# Patient Record
Sex: Male | Born: 1990
Health system: Southern US, Community
[De-identification: ages and names within clinical notes are randomized; demographics above are authoritative.]

---

## 2020-01-07 ENCOUNTER — Other Ambulatory Visit: Payer: Self-pay

## 2020-01-07 DIAGNOSIS — Z20822 Contact with and (suspected) exposure to covid-19: Secondary | ICD-10-CM

## 2020-01-09 LAB — NOVEL CORONAVIRUS, NAA: SARS-CoV-2, NAA: NOT DETECTED

## 2020-01-09 LAB — SARS-COV-2, NAA 2 DAY TAT

## 2020-05-26 ENCOUNTER — Ambulatory Visit: Payer: Self-pay

## 2020-11-07 DIAGNOSIS — Z20822 Contact with and (suspected) exposure to covid-19: Secondary | ICD-10-CM | POA: Diagnosis not present

## 2020-11-17 DIAGNOSIS — Z130 Encounter for screening for diseases of the blood and blood-forming organs and certain disorders involving the immune mechanism: Secondary | ICD-10-CM | POA: Diagnosis not present

## 2020-11-17 DIAGNOSIS — Z13228 Encounter for screening for other metabolic disorders: Secondary | ICD-10-CM | POA: Diagnosis not present

## 2020-11-17 DIAGNOSIS — B009 Herpesviral infection, unspecified: Secondary | ICD-10-CM | POA: Diagnosis not present

## 2020-11-17 DIAGNOSIS — Z0000 Encounter for general adult medical examination without abnormal findings: Secondary | ICD-10-CM | POA: Diagnosis not present

## 2020-11-17 DIAGNOSIS — Z1329 Encounter for screening for other suspected endocrine disorder: Secondary | ICD-10-CM | POA: Diagnosis not present

## 2020-11-17 DIAGNOSIS — Z1389 Encounter for screening for other disorder: Secondary | ICD-10-CM | POA: Diagnosis not present

## 2020-11-17 DIAGNOSIS — Z13220 Encounter for screening for lipoid disorders: Secondary | ICD-10-CM | POA: Diagnosis not present

## 2021-02-09 ENCOUNTER — Encounter (HOSPITAL_BASED_OUTPATIENT_CLINIC_OR_DEPARTMENT_OTHER): Payer: Self-pay

## 2021-02-09 ENCOUNTER — Other Ambulatory Visit: Payer: Self-pay

## 2021-02-09 ENCOUNTER — Emergency Department (HOSPITAL_BASED_OUTPATIENT_CLINIC_OR_DEPARTMENT_OTHER): Payer: 59 | Admitting: Radiology

## 2021-02-09 ENCOUNTER — Emergency Department (HOSPITAL_BASED_OUTPATIENT_CLINIC_OR_DEPARTMENT_OTHER)
Admission: EM | Admit: 2021-02-09 | Discharge: 2021-02-09 | Disposition: A | Discharge status: Home / Self Care | Payer: 59 | Attending: Emergency Medicine | Admitting: Emergency Medicine

## 2021-02-09 DIAGNOSIS — M6798 Unspecified disorder of synovium and tendon, other site: Secondary | ICD-10-CM

## 2021-02-09 DIAGNOSIS — M67921 Unspecified disorder of synovium and tendon, right upper arm: Secondary | ICD-10-CM | POA: Diagnosis not present

## 2021-02-09 DIAGNOSIS — M79602 Pain in left arm: Secondary | ICD-10-CM | POA: Diagnosis not present

## 2021-02-09 DIAGNOSIS — M25532 Pain in left wrist: Secondary | ICD-10-CM

## 2021-02-09 DIAGNOSIS — M6790 Unspecified disorder of synovium and tendon, unspecified site: Secondary | ICD-10-CM

## 2021-02-09 DIAGNOSIS — S6992XA Unspecified injury of left wrist, hand and finger(s), initial encounter: Secondary | ICD-10-CM | POA: Diagnosis not present

## 2021-02-09 MED ORDER — KETOROLAC TROMETHAMINE 15 MG/ML IJ SOLN
30.0000 mg | Freq: Once | INTRAMUSCULAR | Status: AC
  Administered 2021-02-09: 30 mg via INTRAMUSCULAR
  Filled 2021-02-09: qty 2

## 2021-02-09 NOTE — ED Triage Notes (Signed)
Pain in left wrist for a couple weeks. Last night was dead lifting and felt something pop.

## 2021-02-09 NOTE — Discharge Instructions (Signed)
Your left hand is neurovascularly intact with intact motor function along the nerves that supply your hand.  You have good radial pulses.  There was no fracture on x-ray imaging identified.  Your symptoms are possibly consistent with a tendon tear.  Recommend rest, immobilization with a splint, ice for swelling, NSAIDs for pain control and follow-up with sports medicine.

## 2021-02-09 NOTE — ED Provider Notes (Signed)
[facility] EMERGENCY DEPT Provider Note   CSN: [id number] Arrival date & time: 02/09/21  0901     History Chief Complaint  Patient presents with   Wrist Pain    Ronald Swanson is a 30 y.o. male.  The history is provided by the patient.  Wrist Pain This is a new problem. The current episode started yesterday. The problem occurs constantly. The problem has not changed since onset.Pertinent negatives include no chest pain, no abdominal pain and no shortness of breath. He has tried rest for the symptoms. The treatment provided no relief.   The patient states that he has had intermittent pain in his left wrist for the past couple of weeks.  Last night he was lifting weights when he heard and felt a pop in his left wrist.  Since then he has had pain in his left wrist with mild swelling.  He denies any trauma to his wrist.  He states he has had some difficulty with extension of his wrist.  Pain is sharp and moderate in intensity and nonradiating.  Nothing makes it better or worse.   History reviewed. No pertinent past medical history.  There are no problems to display for this patient.   History reviewed. No pertinent surgical history.     No family history on file.  Social History   Tobacco Use   Smoking status: Never   Smokeless tobacco: Never  Vaping Use   Vaping Use: Never used  Substance Use Topics   Alcohol use: Yes   Drug use: Yes    Types: Marijuana    Home Medications Prior to Admission medications   Not on File    Allergies    Patient has no known allergies.  Review of Systems   Review of Systems  Constitutional:  Negative for chills and fever.  HENT:  Negative for ear pain and sore throat.   Eyes:  Negative for pain and visual disturbance.  Respiratory:  Negative for cough and shortness of breath.   Cardiovascular:  Negative for chest pain and palpitations.  Gastrointestinal:  Negative for abdominal pain and vomiting.  Genitourinary:   Negative for dysuria and hematuria.  Musculoskeletal:  Positive for arthralgias. Negative for back pain.  Skin:  Negative for color change and rash.  Neurological:  Negative for seizures and syncope.  All other systems reviewed and are negative.  Physical Exam Updated Vital Signs BP 138/63 (BP Location: Right Arm)   Pulse (!) 55   Temp 98.3 F (36.8 C) (Oral)   Resp 16   Ht 5\' 7"  (1.702 m)   Wt 72.6 kg   SpO2 100%   BMI 25.06 kg/m   Physical Exam Vitals and nursing note reviewed.  Constitutional:      Appearance: He is well-developed.  HENT:     Head: Normocephalic and atraumatic.  Eyes:     Conjunctiva/sclera: Conjunctivae normal.  Cardiovascular:     Rate and Rhythm: Normal rate and regular rhythm.     Heart sounds: No murmur heard. Pulmonary:     Effort: Pulmonary effort is normal. No respiratory distress.     Breath sounds: Normal breath sounds.  Abdominal:     Palpations: Abdomen is soft.     Tenderness: There is no abdominal tenderness.  Musculoskeletal:     Cervical back: Neck supple.     Comments: Left upper extremity: Mild tenderness of the dorsum of the wrist.  Intact motor function of the median, ulnar, radial nerves.  2+ radial  pulses.  Wrist range of motion intact with ability to both extend and flex the wrist.  Skin:    General: Skin is warm and dry.  Neurological:     Mental Status: He is alert.     Comments: No left arm sensory deficit.    ED Results / Procedures / Treatments   Labs (all labs ordered are listed, but only abnormal results are displayed) Labs Reviewed - No data to display  EKG None  Radiology DG Wrist Complete Left  Result Date: 02/09/2021 CLINICAL DATA:  Injury EXAM: LEFT WRIST - COMPLETE 3+ VIEW COMPARISON:  None. FINDINGS: There is no evidence of fracture or dislocation. There is no evidence of arthropathy or other focal bone abnormality. Soft tissues are unremarkable. IMPRESSION: Negative. Electronically Signed   By: Guadlupe Spanish M.D.   On: 02/09/2021 10:18    Procedures Procedures   Medications Ordered in ED Medications  ketorolac (TORADOL) 15 MG/ML injection 30 mg (30 mg Intramuscular Given 02/09/21 1028)    ED Course  I have reviewed the triage vital signs and the nursing notes.  Pertinent labs & imaging results that were available during my care of the patient were reviewed by me and considered in my medical decision making (see chart for details).    MDM Rules/Calculators/A&P                           30 year old male presenting to the Emergency Department with left wrist pain after lifting weights.  He has had some symptoms of pain in his left wrist over the past few weeks but last night felt a pop in his wrist while doing dead lifts.  On exam, the patient had mild tenderness of the dorsum of the wrist, otherwise was neurovascularly intact.  He had intact range of motion of the wrist with intact flexion and extension.  Suspect potential tendon tear versus tendinopathy.  X-ray imaging of the wrist was obtained which did not reveal acute fracture or dislocation.  We will place the patient in a wrist splint.  Advised rest, ice, elevation with swelling, NSAIDs for pain control.  Referral was placed to sports medicine for follow-up and the patient was advised to schedule appointment for follow-up for reassessment in clinic.   Final Clinical Impression(s) / ED Diagnoses Final diagnoses:  Left wrist pain  Tendinopathy of upper extremity    Rx / DC Orders ED Discharge Orders          Ordered    AMB referral to sports medicine        02/09/21 1036             Ernie Avena, MD 02/09/21 1105

## 2021-02-13 NOTE — Progress Notes (Signed)
Subjective:    CC: L wrist pain  I, Molly Weber, LAT, ATC, am serving as scribe for Dr. Clementeen Graham.  HPI: Pt is a 30 y/o RHD male presenting w/ c/o intermittent L wrist pain x approximately one month that worsened on 02/08/21 when he felt a pop in his L wrist while lifting weights doing deadlifts w/ a mixed grip (L wrist was in a supinated position).  He was seen at the [facility] ED on 02/09/21.  Today, pt reports that his L wrist pain has not changed since the time of injury.  He locates his pain to his dorsal wrist favoring ulnar side.  He also notes some pain in the dorsal radial wrist as well.  He notes in early August he was bench pressing and his wrist suddenly extended and presented pain as well.  His wrist has been bothering him a little bit since then and worsened again late 02-15-23 with the dead lifting incident above.  L wrist swelling: yes L wrist mechanical symptoms: not currently  Aggravating factors: L wrist extension, F and UD AROM;  Treatments tried: wrist brace; IBU; IcyHot; ice  Diagnostic testing: L wrist XR- 02/09/21  Pertinent review of Systems: No fevers or chills  Relevant historical information: Otherwise healthy.  Works for [employer] and [employer].   Objective:    Vitals:   02/14/21 0842  BP: 102/78  Pulse: (!) 58  SpO2: 99%   General: Well Developed, well nourished, and in no acute distress.   MSK: Left wrist mild swelling. Normal-appearing joints. Tender palpation dorsal mid wrist and tender palpation dorsal ulnar wrist and tender palpation at TFCC region Some pain with resisted wrist extension and ulnar deviation. Some pain with resisted wrist flexion and ulnar deviation. Strength is intact. Pulses cap refill and sensation are intact. Motion is intact.  Lab and Radiology Results DG Wrist Complete Left  Result Date: 02/09/2021 CLINICAL DATA:  Injury EXAM: LEFT WRIST - COMPLETE 3+ VIEW COMPARISON:  None. FINDINGS: There is no  evidence of fracture or dislocation. There is no evidence of arthropathy or other focal bone abnormality. Soft tissues are unremarkable. IMPRESSION: Negative. Electronically Signed   By: Guadlupe Spanish M.D.   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  Diagnostic Limited MSK Ultrasound of: Left wrist Mild joint effusion dorsal wrist. Relatively normal-appearing dorsal wrist tendon structures. Trace fluid surrounding extensor carpi ulnaris tendon. Slight hypoechoic change within TFCC indicating possible tear. Flexor carpi ulnaris tendon is normal-appearing Impression: Mild wrist effusion and possible TFCC tear.   Impression and Recommendations:    Assessment and Plan: 30 y.o. male with left wrist pain.  Patient reinjured his wrist after initial injury in early August 2022.  His original injury was a sudden wrist extension injury with his wrist loaded doing a bench press.  The more recent injury occurred with his wrist in the supinated position doing a dead lift.  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  Based on his recurrent injury, wrist effusion, and x-ray per my read concerning for scapholunate disassociation I feel the next step should be MRI arthrogram to further evaluate this injury.  Additionally he is currently immobilized with a conventional Velcro wrist brace.  If this is not sufficient a thumb spica wrist brace would be more immobilizing.  Recheck after  MRI arthrogram.  PDMP not reviewed this encounter. Orders Placed This Encounter  Procedures   [place] LIMITED JOINT SPACE STRUCTURES UP LEFT(NO LINKED CHARGES)    Order Specific Question:   Reason for Exam (SYMPTOM  OR DIAGNOSIS REQUIRED)    Answer:   L wrist pain    Order Specific Question:   Preferred imaging location?    Answer:   [facility]ult [facility]   [place] LIMITED JOINT SPACE STRUCTURES UP LEFT(NO LINKED CHARGES)    Order Specific Question:   Reason for Exam (SYMPTOM  OR DIAGNOSIS REQUIRED)    Answer:   eval left wrist pain    Order Specific Question:   Preferred imaging location?    Answer:   Adult nurse [facility]   MR WRIST LEFT W CONTRAST    L wrist MRI arthrogram only.  No IV contrast.  Schedule w/ Dr. Karie Schwalbe one hour before for injection.    Standing Status:   Future    Standing Expiration Date:   02/14/2022    Scheduling Instructions:     L wrist MRI arthrogram only.  No IV contrast.  Schedule w/ Dr. Karie Schwalbe one hour before for injection.    Order Specific Question:   If indicated for the ordered procedure, I authorize the administration of contrast media per Radiology protocol    Answer:   Yes    Order Specific Question:   What is the patient's sedation requirement?    Answer:   No Sedation    Order Specific Question:   Does the patient have a pacemaker or implanted devices?    Answer:   No    Order Specific Question:   Preferred imaging location?    Answer:   [facility] (table limit-350lbs)   No orders of the defined types were placed in this encounter.   Discussed warning signs or symptoms. Please see discharge instructions. Patient expresses understanding.   The above documentation has been reviewed and is accurate and complete Clementeen Graham, M.D.

## 2021-02-14 ENCOUNTER — Ambulatory Visit (INDEPENDENT_AMBULATORY_CARE_PROVIDER_SITE_OTHER): Payer: 59 | Admitting: Family Medicine

## 2021-02-14 ENCOUNTER — Ambulatory Visit: Payer: Self-pay

## 2021-02-14 ENCOUNTER — Other Ambulatory Visit: Payer: Self-pay

## 2021-02-14 ENCOUNTER — Encounter: Payer: Self-pay | Admitting: Family Medicine

## 2021-02-14 VITALS — BP 102/78 | HR 58 | Ht 67.0 in | Wt 168.0 lb

## 2021-02-14 DIAGNOSIS — M25332 Other instability, left wrist: Secondary | ICD-10-CM

## 2021-02-14 DIAGNOSIS — M25532 Pain in left wrist: Secondary | ICD-10-CM | POA: Diagnosis not present

## 2021-02-14 NOTE — Patient Instructions (Addendum)
Nice to meet you.  You should hear from MRI scheduling within 1 week. If you do not hear please let me know.    Recheck after MRI.   Use the brace.   We will hold off on exercise until after MRI results.    Please use Voltaren gel (Generic Diclofenac Gel) up to 4x daily for pain as needed.  This is available over-the-counter as both the name brand Voltaren gel and the generic diclofenac gel.    Scapholunate Dissociation Scapholunate dissociation is a type of wrist injury. The wrist is made up of eight bones that are arranged in two rows. In the row of bones that is nearest the bones of the forearm, two bones, the scaphoid bone and the lunate bone, are connected and held in place by a tough band of tissue (scapholunate ligament). Scapholunate dissociation means that this ligament has been torn and the bones have been separated. This injury often happens at the same time that a wrist bone is broken. This condition can cause wrist pain and weakness. What are the causes? This condition is usually caused by: A sudden injury, like a hard fall onto an outstretched hand. The ligament can tear if your hand bends back too far or your wrist twists forcefully. Long-term wear and tear on the ligament from overuse. This can happen if you repeatedly use your wrist at work or during sporting activities. What increases the risk? The following factors may make you more likely to develop this injury: Being older or having a higher risk of falling. Doing sports that involve a high risk of falling. These sports include skating, running, and contact sports. Using your wrist forcefully at work or in sports. Having gout or rheumatoid arthritis. What are the signs or symptoms? The main symptom of this condition is pain, especially when you bend your wrist. Other symptoms include: A tearing sensation and a noise, like a pop or a snap, if you fall on your hand. Swelling. Tenderness. Weakness and  stiffness. Bruising. A clicking sound when you move your wrist. How is this diagnosed? This condition may be diagnosed based on: Your symptoms and a physical exam. Your health care provider will check for tenderness and pain on your wrist. Your medical history, especially if you have had a recent injury. Imaging studies, such as X-rays or an MRI. Arthroscopy. This uses a device with a light and camera to check your injury. Your injury may also be repaired using this device. How is this treated? Treatment for this condition depends on the severity of your tear. For a partial tear, you may have nonsurgical treatments at first. These may include: Wearing a cast or splint on your wrist. Icing your wrist. Taking NSAIDs, such as ibuprofen, to reduce pain and inflammation. Starting stretching and strengthening exercises (physical therapy) after your cast or splint is removed. For a complete tear, you may need wrist surgery. Surgery may also be done if previous treatments have not worked. Follow these instructions at home: If you have a splint: Wear it as told by your health care provider. Remove it only as told by your health care provider. Loosen it if your fingers tingle, become numb, or turn cold and blue. If you have a cast: Do not stick anything inside it to scratch your skin. Doing that increases your risk of infection. You may put lotion on dry skin around the edges of the cast. Do not put lotion on the skin underneath it. If you have a  cast or splint: Do not put pressure on any part of the cast or splint until it is fully hardened. This may take several hours. Check the skin around it every day. Tell your health care provider about any concerns. Keep it clean and dry. If the cast or splint is not waterproof: Do not let it get wet. Cover it with a waterproof covering when you take a bath or shower. Managing pain, stiffness, and swelling  If directed, put ice on your wrist area. If  you have a removable splint, remove it as told by your health care provider. Put ice in a plastic bag. Place a towel between your skin and the bag or between your cast and the bag. Leave the ice on for 20 minutes, 2-3 times a day. Move your fingers often to reduce stiffness and swelling. Raise (elevate) your wrist above the level of your heart while you are sitting or lying down. Activity Do not lift anything that is heavier than 10 lb (4.5 kg), or the limit that you are told, until your health care provider says that it is safe. Return to your normal activities as told by your health care provider. Ask your health care provider what activities are safe for you. Ask your health care provider when it is safe to drive if you have a cast or splint on your arm. Do exercises only as told by your health care provider. General instructions Take over-the-counter and prescription medicines only as told by your health care provider. Do not use any products that contain nicotine or tobacco, such as cigarettes, e-cigarettes, and chewing tobacco. These can delay bone healing. If you need help quitting, ask your health care provider. Keep all follow-up visits as told by your health care provider. This is important. These may include visits with a physical therapist. Contact a health care provider if: Your symptoms do not get better or they get worse. You have pain, numbness, or coldness in your hand or fingers. Your cast or splint becomes loose or damaged. Get help right away if: You lose feeling in your fingers or hand. Your fingers or fingernails turn blue or gray. Summary Scapholunate dissociation means that the ligament connecting the scaphoid and lunate bones has been torn, and the bones have been separated. This injury often happens at the same time that a wrist bone is broken. This condition can cause wrist pain and weakness. This condition is usually caused by a sudden injury, like a hard fall  onto an outstretched hand. It can also be caused by long-term wear and tear on the ligament from overuse. Treatment for this condition depends on the severity of your tear. It is treated with rest, ice, medicines, physical therapy, and, if needed, surgery. This information is not intended to replace advice given to you by your health care provider. Make sure you discuss any questions you have with your health care provider. Document Revised: 05/27/2018 Document Reviewed: 05/27/2018 Elsevier Patient Education  2022 [employer].

## 2021-02-26 ENCOUNTER — Ambulatory Visit: Payer: 59 | Admitting: Sports Medicine

## 2021-02-26 ENCOUNTER — Ambulatory Visit (INDEPENDENT_AMBULATORY_CARE_PROVIDER_SITE_OTHER): Payer: 59

## 2021-02-26 ENCOUNTER — Other Ambulatory Visit: Payer: Self-pay

## 2021-02-26 DIAGNOSIS — M25532 Pain in left wrist: Secondary | ICD-10-CM

## 2021-02-26 DIAGNOSIS — G8929 Other chronic pain: Secondary | ICD-10-CM

## 2021-02-26 DIAGNOSIS — S63512A Sprain of carpal joint of left wrist, initial encounter: Secondary | ICD-10-CM | POA: Diagnosis not present

## 2021-02-26 MED ORDER — GADOBUTROL 1 MMOL/ML IV SOLN
1.0000 mL | Freq: Once | INTRAVENOUS | Status: AC | PRN
  Administered 2021-02-26: 1 mL via INTRAVENOUS

## 2021-02-26 NOTE — Assessment & Plan Note (Signed)
Arthrogram injection for wrist MRI, further management per primary treating provider.

## 2021-02-26 NOTE — Progress Notes (Signed)
    Procedures performed today:    Procedure: Real-time Ultrasound Guided gadolinium contrast injection of left radiocarpal joint Device: Samsung HS60  Verbal informed consent obtained.  Time-out conducted.  Noted no overlying erythema, induration, or other signs of local infection.  Skin prepped in a sterile fashion.  Local anesthesia: Topical Ethyl chloride.  With sterile technique and under real time ultrasound guidance: Noted a small amount of joint swelling.  25-gauge needle advanced into the radiocarpal joint, I then injected 1 cc kenalog 40, 1 cc lidocaine, syringe switched and 0.05 mL gadolinium injected, syringe again switched and approximately 1 mL saline used to fully distend the joint. Joint visualized and capsule seen distending confirming intra-articular placement of contrast material and medication. Completed without difficulty  Advised to call if fevers/chills, erythema, induration, drainage, or persistent bleeding.  Images permanently stored in PACS Impression: Technically successful ultrasound guided gadolinium contrast injection for MR arthrography.  Please see separate MR arthrogram report.  Independent interpretation of notes and tests performed by another provider:   None.  Brief History, Exam, Impression, and Recommendations:    Chronic pain of left wrist Arthrogram injection for wrist MRI, further management per primary treating provider.    ___________________________________________ Ihor Austin. Benjamin Stain, M.D., ABFM., CAQSM. Primary Care and Sports Medicine [facility]  Adjunct Instructor of Family Medicine  [employer]

## 2021-02-28 ENCOUNTER — Telehealth: Payer: Self-pay | Admitting: Family Medicine

## 2021-02-28 DIAGNOSIS — M25332 Other instability, left wrist: Secondary | ICD-10-CM

## 2021-02-28 DIAGNOSIS — M25532 Pain in left wrist: Secondary | ICD-10-CM

## 2021-02-28 NOTE — Progress Notes (Signed)
MRI does show a tear of the scapholunate ligament.  This is a potential surgical problem and I do think it is reasonable for you to have a consultation with the hand surgeon.  I will place a referral to orthopedic hand surgery so that you can have a consultation about your surgical options.  If you would like to come in and talk to me in full detail about the MRI results and discussed the plan in further detail before the appointment with the hand surgery that is certainly is reasonable.  But I have already placed the referral and do not want to delay your care.

## 2021-02-28 NOTE — Telephone Encounter (Signed)
Referral to orthopedic hand surgery placed today.

## 2021-03-02 ENCOUNTER — Ambulatory Visit: Payer: 59 | Admitting: Family Medicine

## 2021-03-05 ENCOUNTER — Ambulatory Visit (INDEPENDENT_AMBULATORY_CARE_PROVIDER_SITE_OTHER): Payer: 59 | Admitting: Orthopedic Surgery

## 2021-03-05 ENCOUNTER — Ambulatory Visit: Payer: Self-pay

## 2021-03-05 ENCOUNTER — Ambulatory Visit (INDEPENDENT_AMBULATORY_CARE_PROVIDER_SITE_OTHER): Payer: 59

## 2021-03-05 ENCOUNTER — Other Ambulatory Visit: Payer: Self-pay

## 2021-03-05 ENCOUNTER — Encounter: Payer: Self-pay | Admitting: Orthopedic Surgery

## 2021-03-05 VITALS — BP 121/68 | HR 66 | Ht 67.0 in | Wt 169.0 lb

## 2021-03-05 DIAGNOSIS — M25531 Pain in right wrist: Secondary | ICD-10-CM

## 2021-03-05 DIAGNOSIS — S638X2A Sprain of other part of left wrist and hand, initial encounter: Secondary | ICD-10-CM

## 2021-03-05 DIAGNOSIS — M25532 Pain in left wrist: Secondary | ICD-10-CM | POA: Diagnosis not present

## 2021-03-05 NOTE — H&P (View-Only) (Signed)
Office Visit Note   Patient: Ronald Swanson           Date of Birth: 1990-07-28           MRN: [id number] Visit Date: 03/05/2021              Requested by: Rodolph Bong, MD [address] [place],  [place] [address] PCP: Morrell Riddle, PA-C   Assessment & Plan: Visit Diagnoses:  1. Pain in left wrist   2. Pain in right wrist     Plan: Discussed with patient that he has a tear of the left scapholunate ligament.  We discussed the nature of this injury.  He is very active and into weightlifting.  Given his age and lifestyle, we discussed scapholunate ligament repair versus reconstruction.  After discussion, he would like to proceed with the surgery.  Risks, benefits, and alternatives of surgery were also discussed.  We will plan for a possible repair supplemented with internal brace augmentation versus ligament reconstruction.  The office will contact the patient regarding surgery dates and times.  Follow-Up Instructions: No follow-ups on file.   Orders:  Orders Placed This Encounter  Procedures   XR Wrist 2 Views Right   No orders of the defined types were placed in this encounter.     Procedures: No procedures performed   Clinical Data: No additional findings.   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  His symptoms started around September 30 when he was weightlifting.  He performed a dead lift and felt a pop with pain in his left wrist.  Since that time is noticed pain at the dorsal central aspect of the wrist with intermittent swelling.  He has difficulty with physical activity in the gym.  His pain can be as bad as 10/10.  He has been wearing a removable wrist brace with some symptom relief.  He denies pain in the right wrist.  He denies any injury prior to this  incident.   Review of Systems   Objective: Vital Signs: BP 121/68 (BP Location: Right Arm, Patient Position: Sitting, Cuff Size: Normal)   Pulse 66   Ht 5\' 7"  (1.702 m)   Wt 169 lb (76.7 kg)   SpO2 97%   BMI 26.47 kg/m   Physical Exam Constitutional:      Appearance: Normal appearance.  Cardiovascular:     Rate and Rhythm: Normal rate.     Pulses: Normal pulses.  Pulmonary:     Effort: Pulmonary effort is normal.  Skin:    General: Skin is warm and dry.     Capillary Refill: Capillary refill takes less than 2 seconds.  Neurological:     Mental Status: He is alert.    Left Hand Exam   Tenderness  Left hand tenderness location: TTP at dorsal central aspect of wrist in area of SL interval.  No significant swelling today.   Other  Sensation: normal Pulse: present  Comments:  Full wrist ROM w/ some dorsal central discomfort.  TTP at SL interval.  Negative Watson scaphoid shift test.      Specialty Comments:  No specialty comments available.  Imaging: Outside imaging reviewed interpreted by me today.  Previous 4 view x-ray of the left wrist demonstrates diastases of the SL interval.  There  does not appear to be any DC deformity or scaphoid flexion present.  Previous MRI of the left wrist also reviewed interpreted by me today.  It demonstrates rupture of the dorsal and interosseous scapholunate ligament with SL diastases. 2 views of the right wrist taken today reviewed interpreted by me.  They do not demonstrate any static SL diastases as is seen on imaging of the left side.   PMFS History: Patient Active Problem List   Diagnosis Date Noted   Chronic pain of left wrist 02/26/2021   History reviewed. No pertinent past medical history.  History reviewed. No pertinent family history.  History reviewed. No pertinent surgical history. Social History   Occupational History   Not on file  Tobacco Use   Smoking status: Never   Smokeless tobacco: Never  Vaping Use    Vaping Use: Never used  Substance and Sexual Activity   Alcohol use: Yes   Drug use: Yes    Types: Marijuana   Sexual activity: Not on file

## 2021-03-05 NOTE — Progress Notes (Signed)
Office Visit Note   Patient: Ronald Swanson           Date of Birth: 1990/10/28           MRN: [id number] Visit Date: 03/05/2021              Requested by: Rodolph Bong, MD [address] [place],  [place] [address] PCP: Morrell Riddle, PA-C   Assessment & Plan: Visit Diagnoses:  1. Pain in left wrist   2. Pain in right wrist     Plan: Discussed with patient that he has a tear of the left scapholunate ligament.  We discussed the nature of this injury.  He is very active and into weightlifting.  Given his age and lifestyle, we discussed scapholunate ligament repair versus reconstruction.  After discussion, he would like to proceed with the surgery.  Risks, benefits, and alternatives of surgery were also discussed.  We will plan for a possible repair supplemented with internal brace augmentation versus ligament reconstruction.  The office will contact the patient regarding surgery dates and times.  Follow-Up Instructions: No follow-ups on file.   Orders:  Orders Placed This Encounter  Procedures   XR Wrist 2 Views Right   No orders of the defined types were placed in this encounter.     Procedures: No procedures performed   Clinical Data: No additional findings.   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  His symptoms started around September 30 when he was weightlifting.  He performed a dead lift and felt a pop with pain in his left wrist.  Since that time is noticed pain at the dorsal central aspect of the wrist with intermittent swelling.  He has difficulty with physical activity in the gym.  His pain can be as bad as 10/10.  He has been wearing a removable wrist brace with some symptom relief.  He denies pain in the right wrist.  He denies any injury prior to this  incident.   Review of Systems   Objective: Vital Signs: BP 121/68 (BP Location: Right Arm, Patient Position: Sitting, Cuff Size: Normal)   Pulse 66   Ht 5\' 7"  (1.702 m)   Wt 169 lb (76.7 kg)   SpO2 97%   BMI 26.47 kg/m   Physical Exam Constitutional:      Appearance: Normal appearance.  Cardiovascular:     Rate and Rhythm: Normal rate.     Pulses: Normal pulses.  Pulmonary:     Effort: Pulmonary effort is normal.  Skin:    General: Skin is warm and dry.     Capillary Refill: Capillary refill takes less than 2 seconds.  Neurological:     Mental Status: He is alert.    Left Hand Exam   Tenderness  Left hand tenderness location: TTP at dorsal central aspect of wrist in area of SL interval.  No significant swelling today.   Other  Sensation: normal Pulse: present  Comments:  Full wrist ROM w/ some dorsal central discomfort.  TTP at SL interval.  Negative Watson scaphoid shift test.      Specialty Comments:  No specialty comments available.  Imaging: Outside imaging reviewed interpreted by me today.  Previous 4 view x-ray of the left wrist demonstrates diastases of the SL interval.  There  does not appear to be any DC deformity or scaphoid flexion present.  Previous MRI of the left wrist also reviewed interpreted by me today.  It demonstrates rupture of the dorsal and interosseous scapholunate ligament with SL diastases. 2 views of the right wrist taken today reviewed interpreted by me.  They do not demonstrate any static SL diastases as is seen on imaging of the left side.   PMFS History: Patient Active Problem List   Diagnosis Date Noted   Chronic pain of left wrist 02/26/2021   History reviewed. No pertinent past medical history.  History reviewed. No pertinent family history.  History reviewed. No pertinent surgical history. Social History   Occupational History   Not on file  Tobacco Use   Smoking status: Never   Smokeless tobacco: Never  Vaping Use    Vaping Use: Never used  Substance and Sexual Activity   Alcohol use: Yes   Drug use: Yes    Types: Marijuana   Sexual activity: Not on file

## 2021-03-09 ENCOUNTER — Other Ambulatory Visit: Payer: Self-pay

## 2021-03-09 ENCOUNTER — Encounter (HOSPITAL_BASED_OUTPATIENT_CLINIC_OR_DEPARTMENT_OTHER): Payer: Self-pay | Admitting: Orthopedic Surgery

## 2021-03-16 ENCOUNTER — Encounter (HOSPITAL_BASED_OUTPATIENT_CLINIC_OR_DEPARTMENT_OTHER): Payer: Self-pay | Admitting: Orthopedic Surgery

## 2021-03-16 ENCOUNTER — Other Ambulatory Visit: Payer: Self-pay

## 2021-03-16 ENCOUNTER — Ambulatory Visit (HOSPITAL_BASED_OUTPATIENT_CLINIC_OR_DEPARTMENT_OTHER): Payer: 59 | Admitting: Anesthesiology

## 2021-03-16 ENCOUNTER — Ambulatory Visit (HOSPITAL_BASED_OUTPATIENT_CLINIC_OR_DEPARTMENT_OTHER): Payer: 59

## 2021-03-16 ENCOUNTER — Ambulatory Visit (HOSPITAL_BASED_OUTPATIENT_CLINIC_OR_DEPARTMENT_OTHER)
Admission: RE | Admit: 2021-03-16 | Discharge: 2021-03-16 | Disposition: A | Discharge status: Home / Self Care | Payer: 59 | Attending: Orthopedic Surgery | Admitting: Orthopedic Surgery

## 2021-03-16 ENCOUNTER — Encounter (HOSPITAL_BASED_OUTPATIENT_CLINIC_OR_DEPARTMENT_OTHER)
Admission: RE | Disposition: A | Discharge status: Home / Self Care | Payer: Self-pay | Source: Home / Self Care | Attending: Orthopedic Surgery

## 2021-03-16 DIAGNOSIS — G8929 Other chronic pain: Secondary | ICD-10-CM | POA: Diagnosis not present

## 2021-03-16 DIAGNOSIS — X500XXA Overexertion from strenuous movement or load, initial encounter: Secondary | ICD-10-CM | POA: Diagnosis not present

## 2021-03-16 DIAGNOSIS — M25532 Pain in left wrist: Secondary | ICD-10-CM | POA: Diagnosis present

## 2021-03-16 DIAGNOSIS — F1290 Cannabis use, unspecified, uncomplicated: Secondary | ICD-10-CM | POA: Insufficient documentation

## 2021-03-16 DIAGNOSIS — S63592A Other specified sprain of left wrist, initial encounter: Secondary | ICD-10-CM | POA: Insufficient documentation

## 2021-03-16 DIAGNOSIS — Y93B3 Activity, free weights: Secondary | ICD-10-CM | POA: Insufficient documentation

## 2021-03-16 DIAGNOSIS — S63599A Other specified sprain of unspecified wrist, initial encounter: Secondary | ICD-10-CM

## 2021-03-16 DIAGNOSIS — S638X2A Sprain of other part of left wrist and hand, initial encounter: Secondary | ICD-10-CM | POA: Diagnosis not present

## 2021-03-16 HISTORY — PX: LIGAMENT REPAIR: SHX5444

## 2021-03-16 SURGERY — REPAIR, LIGAMENT
Anesthesia: Monitor Anesthesia Care | Site: Thumb | Laterality: Left

## 2021-03-16 MED ORDER — ONDANSETRON HCL 4 MG/2ML IJ SOLN
INTRAMUSCULAR | Status: DC | PRN
  Administered 2021-03-16: 4 mg via INTRAVENOUS

## 2021-03-16 MED ORDER — MIDAZOLAM HCL 5 MG/5ML IJ SOLN
INTRAMUSCULAR | Status: DC | PRN
  Administered 2021-03-16: 2 mg via INTRAVENOUS

## 2021-03-16 MED ORDER — OXYCODONE HCL 5 MG PO TABS: 5.0000 mg | ORAL_TABLET | ORAL | 0 refills | Status: AC | PRN

## 2021-03-16 MED ORDER — ONDANSETRON HCL 4 MG/2ML IJ SOLN
INTRAMUSCULAR | Status: AC
  Filled 2021-03-16: qty 2

## 2021-03-16 MED ORDER — MIDAZOLAM HCL 2 MG/2ML IJ SOLN
INTRAMUSCULAR | Status: AC
  Filled 2021-03-16: qty 2

## 2021-03-16 MED ORDER — CEFAZOLIN SODIUM-DEXTROSE 2-4 GM/100ML-% IV SOLN
INTRAVENOUS | Status: AC
  Filled 2021-03-16: qty 100

## 2021-03-16 MED ORDER — FENTANYL CITRATE (PF) 100 MCG/2ML IJ SOLN: INTRAMUSCULAR | Status: DC | PRN

## 2021-03-16 MED ORDER — SUCCINYLCHOLINE CHLORIDE 200 MG/10ML IV SOSY
PREFILLED_SYRINGE | INTRAVENOUS | Status: AC
  Filled 2021-03-16: qty 10

## 2021-03-16 MED ORDER — LIDOCAINE 2% (20 MG/ML) 5 ML SYRINGE
INTRAMUSCULAR | Status: AC
  Filled 2021-03-16: qty 5

## 2021-03-16 MED ORDER — FENTANYL CITRATE (PF) 100 MCG/2ML IJ SOLN
INTRAMUSCULAR | Status: AC
  Filled 2021-03-16: qty 2

## 2021-03-16 MED ORDER — FENTANYL CITRATE (PF) 100 MCG/2ML IJ SOLN
100.0000 ug | Freq: Once | INTRAMUSCULAR | Status: AC
  Administered 2021-03-16: 100 ug via INTRAVENOUS

## 2021-03-16 MED ORDER — FENTANYL CITRATE (PF) 100 MCG/2ML IJ SOLN: 25.0000 ug | INTRAMUSCULAR | Status: DC | PRN

## 2021-03-16 MED ORDER — PROPOFOL 500 MG/50ML IV EMUL
INTRAVENOUS | Status: DC | PRN
  Administered 2021-03-16: 100 ug/kg/min via INTRAVENOUS

## 2021-03-16 MED ORDER — ROPIVACAINE HCL 5 MG/ML IJ SOLN
INTRAMUSCULAR | Status: DC | PRN
  Administered 2021-03-16: 25 mL via PERINEURAL

## 2021-03-16 MED ORDER — CEFAZOLIN SODIUM-DEXTROSE 2-4 GM/100ML-% IV SOLN
2.0000 g | INTRAVENOUS | Status: AC
  Administered 2021-03-16: 2 g via INTRAVENOUS

## 2021-03-16 MED ORDER — 0.9 % SODIUM CHLORIDE (POUR BTL) OPTIME
TOPICAL | Status: DC | PRN
  Administered 2021-03-16: 200 mL

## 2021-03-16 MED ORDER — LACTATED RINGERS IV SOLN: INTRAVENOUS | Status: DC

## 2021-03-16 MED ORDER — ACETAMINOPHEN 10 MG/ML IV SOLN: 1000.0000 mg | Freq: Once | INTRAVENOUS | Status: DC | PRN

## 2021-03-16 MED ORDER — CLONIDINE HCL (ANALGESIA) 100 MCG/ML EP SOLN
EPIDURAL | Status: DC | PRN
  Administered 2021-03-16: 100 ug

## 2021-03-16 MED ORDER — LIDOCAINE 2% (20 MG/ML) 5 ML SYRINGE
INTRAMUSCULAR | Status: DC | PRN
  Administered 2021-03-16: 50 mg via INTRAVENOUS

## 2021-03-16 MED ORDER — MIDAZOLAM HCL 2 MG/2ML IJ SOLN
2.0000 mg | Freq: Once | INTRAMUSCULAR | Status: AC
  Administered 2021-03-16: 2 mg via INTRAVENOUS

## 2021-03-16 SURGICAL SUPPLY — 53 items
ANCH SUT SWVLOCK 8.5 (Anchor) ×1 IMPLANT
ANCHOR DX SWIVELOCK SL 3.5X8.5 (Anchor) ×2 IMPLANT
ANCHOR REPAIR HAND WRIST (Orthopedic Implant) ×2 IMPLANT
APL PRP STRL LF DISP 70% ISPRP (MISCELLANEOUS) ×1
BLADE SURG 15 STRL LF DISP TIS (BLADE) ×5 IMPLANT
BLADE SURG 15 STRL SS (BLADE) ×10
BNDG CMPR 9X4 STRL LF SNTH (GAUZE/BANDAGES/DRESSINGS) ×1
BNDG ELASTIC 3X5.8 VLCR STR LF (GAUZE/BANDAGES/DRESSINGS) ×2 IMPLANT
BNDG ESMARK 4X9 LF (GAUZE/BANDAGES/DRESSINGS) ×2 IMPLANT
BNDG GAUZE ELAST 4 BULKY (GAUZE/BANDAGES/DRESSINGS) ×2 IMPLANT
CHLORAPREP W/TINT 26 (MISCELLANEOUS) ×2 IMPLANT
CORD BIPOLAR FORCEPS 12FT (ELECTRODE) ×2 IMPLANT
COVER BACK TABLE 60X90IN (DRAPES) ×2 IMPLANT
COVER MAYO STAND STRL (DRAPES) ×2 IMPLANT
CUFF TOURN SGL QUICK 18X4 (TOURNIQUET CUFF) IMPLANT
CUFF TOURN SGL QUICK 24 (TOURNIQUET CUFF)
CUFF TRNQT CYL 24X4X16.5-23 (TOURNIQUET CUFF) IMPLANT
DRAPE EXTREMITY T 121X128X90 (DISPOSABLE) ×2 IMPLANT
DRAPE OEC MINIVIEW 54X84 (DRAPES) ×2 IMPLANT
DRAPE U-SHAPE 47X51 STRL (DRAPES) ×2 IMPLANT
GAUZE 4X4 16PLY ~~LOC~~+RFID DBL (SPONGE) ×4 IMPLANT
GAUZE SPONGE 4X4 12PLY STRL (GAUZE/BANDAGES/DRESSINGS) ×2 IMPLANT
GAUZE XEROFORM 1X8 LF (GAUZE/BANDAGES/DRESSINGS) ×2 IMPLANT
GLOVE SURG ENC MOIS LTX SZ7 (GLOVE) ×2 IMPLANT
GLOVE SURG POLYISO LF SZ6.5 (GLOVE) ×2 IMPLANT
GLOVE SURG POLYISO LF SZ7 (GLOVE) ×2 IMPLANT
GLOVE SURG UNDER POLY LF SZ7 (GLOVE) ×6 IMPLANT
GOWN STRL REUS W/ TWL LRG LVL3 (GOWN DISPOSABLE) ×1 IMPLANT
GOWN STRL REUS W/ TWL XL LVL3 (GOWN DISPOSABLE) ×2 IMPLANT
GOWN STRL REUS W/TWL LRG LVL3 (GOWN DISPOSABLE) ×2
GOWN STRL REUS W/TWL XL LVL3 (GOWN DISPOSABLE) ×4
K-WIRE .045X4 (WIRE) ×8 IMPLANT
NEEDLE HYPO 25X1 1.5 SAFETY (NEEDLE) IMPLANT
NS IRRIG 1000ML POUR BTL (IV SOLUTION) ×2 IMPLANT
PACK BASIN DAY SURGERY FS (CUSTOM PROCEDURE TRAY) ×2 IMPLANT
PAD CAST 3X4 CTTN HI CHSV (CAST SUPPLIES) ×1 IMPLANT
PADDING CAST COTTON 3X4 STRL (CAST SUPPLIES) ×2
SLEEVE SCD COMPRESS KNEE MED (STOCKING) IMPLANT
SLING ARM FOAM STRAP LRG (SOFTGOODS) ×2 IMPLANT
SPLINT PLASTER CAST XFAST 4X15 (CAST SUPPLIES) ×10 IMPLANT
SPLINT PLASTER XTRA FAST SET 4 (CAST SUPPLIES) ×10
STOCKINETTE 4X48 STRL (DRAPES) ×2 IMPLANT
SUCTION FRAZIER HANDLE 10FR (MISCELLANEOUS) ×1
SUCTION TUBE FRAZIER 10FR DISP (MISCELLANEOUS) ×1 IMPLANT
SUT ETHIBOND 3-0 V-5 (SUTURE) ×4 IMPLANT
SUT ETHILON 4 0 PS 2 18 (SUTURE) ×4 IMPLANT
SUT MNCRL AB 3-0 PS2 18 (SUTURE) ×2 IMPLANT
SUT MNCRL AB 4-0 PS2 18 (SUTURE) ×2 IMPLANT
SUT VICRYL 4-0 PS2 18IN ABS (SUTURE) IMPLANT
SYR BULB EAR ULCER 3OZ GRN STR (SYRINGE) ×2 IMPLANT
SYR CONTROL 10ML LL (SYRINGE) IMPLANT
TOWEL GREEN STERILE FF (TOWEL DISPOSABLE) ×4 IMPLANT
UNDERPAD 30X36 HEAVY ABSORB (UNDERPADS AND DIAPERS) ×2 IMPLANT

## 2021-03-16 NOTE — Discharge Instructions (Addendum)
Ronald Swanson, M.D. Hand Surgery  POST-OPERATIVE DISCHARGE INSTRUCTIONS   PRESCRIPTIONS: You have been given a prescription to be taken as directed for post-operative pain control.  You may also take over the counter ibuprofen/aleve and tylenol for pain. Take this as directed on the packaging. Do not exceed 3000 mg tylenol/acetaminophen in 24 hours.  Ibuprofen 600-800 mg (3-4) tablets by mouth every 6 hours as needed for pain.  OR Aleve 2 tablets by mouth every 12 hours (twice daily) as needed for pain.  AND/OR Tylenol 1000 mg (2 tablets) every 8 hours as needed for pain.  Please use your pain medication carefully, as refills are limited and you may not be provided with one.  As stated above, please use over the counter pain medicine - it will also be helpful with decreasing your swelling.    ANESTHESIA: After your surgery, post-surgical discomfort or pain is likely. This discomfort can last several days to a few weeks. At certain times of the day your discomfort may be more intense.   Did you receive a nerve block?  A nerve block can provide pain relief for one hour to two days after your surgery. As long as the nerve block is working, you will experience little or no sensation in the area the surgeon operated on.  As the nerve block wears off, you will begin to experience pain or discomfort. It is very important that you begin taking your prescribed pain medication before the nerve block fully wears off. Treating your pain at the first sign of the block wearing off will ensure your pain is better controlled and more tolerable when full-sensation returns. Do not wait until the pain is intolerable, as the medicine will be less effective. It is better to treat pain in advance than to try and catch up.   General Anesthesia:  If you did not receive a nerve block during your surgery, you will need to start taking your pain medication shortly after your surgery and should continue to do  so as prescribed by your surgeon.     ICE AND ELEVATION: You may use ice for the first 48-72 hours, but it is not critical.   Motion of your fingers is very important s to decrease the swelling. Please follow the finger range of motion exercises below to assist you in regaining your motion. This should be done at least 10 repetitions 3-4 times a day.  Elevation, as much as possible for the next 48 hours, is critical for decreasing swelling as well as for pain relief. Elevation means when you are seated or lying down, you hand should be at or above your heart. When walking, the hand needs to be at or above the level of your elbow.  If the bandage gets too tight, it may need to be loosened. Please contact our office and we will instruct you in how to do this.    SURGICAL BANDAGES:  Keep your dressing and/or splint clean and dry at all times.  Do not remove until you are seen again in the office.  If careful, you may place a plastic bag over your bandage and tape the end to shower, but be careful, do not get your bandages wet.     HAND THERAPY:  You may not need any. If you do, we will begin this at your follow up visit in the clinic.    ACTIVITY AND WORK: You are encouraged to move any fingers which are not in the  bandage. Attached is an instruction sheet on finger motion. Do this as much as you need to make your fingers move fully and keep the swelling down.  Light use of the fingers is allowed to assist the other hand with daily hygiene and eating, but strong gripping or lifting is often uncomfortable and should be avoided.  You might miss a variable period of time from work and hopefully this issue has been discussed prior to surgery. You may not do any heavy work with your affected hand for about 2 weeks.    [facility] [address] [place],  [place]  [address] [phone]  Post Anesthesia Home Care Instructions  Activity: Get plenty of rest for the remainder  of the day. A responsible individual must stay with you for 24 hours following the procedure.  For the next 24 hours, DO NOT: -Drive a car -Advertising copywriter -Drink alcoholic beverages -Take any medication unless instructed by your physician -Make any legal decisions or sign important papers.  Meals: Start with liquid foods such as gelatin or soup. Progress to regular foods as tolerated. Avoid greasy, spicy, heavy foods. If nausea and/or vomiting occur, drink only clear liquids until the nausea and/or vomiting subsides. Call your physician if vomiting continues.  Special Instructions/Symptoms: Your throat may feel dry or sore from the anesthesia or the breathing tube placed in your throat during surgery. If this causes discomfort, gargle with warm salt water. The discomfort should disappear within 24 hours.  If you had a scopolamine patch placed behind your ear for the management of post- operative nausea and/or vomiting:  1. The medication in the patch is effective for 72 hours, after which it should be removed.  Wrap patch in a tissue and discard in the trash. Wash hands thoroughly with soap and water. 2. You may remove the patch earlier than 72 hours if you experience unpleasant side effects which may include dry mouth, dizziness or visual disturbances. 3. Avoid touching the patch. Wash your hands with soap and water after contact with the patch.    Regional Anesthesia Blocks  1. Numbness or the inability to move the "blocked" extremity may last from 3-48 hours after placement. The length of time depends on the medication injected and your individual response to the medication. If the numbness is not going away after 48 hours, call your surgeon.  2. The extremity that is blocked will need to be protected until the numbness is gone and the  Strength has returned. Because you cannot feel it, you will need to take extra care to avoid injury. Because it may be weak, you may have difficulty  moving it or using it. You may not know what position it is in without looking at it while the block is in effect.  3. For blocks in the legs and feet, returning to weight bearing and walking needs to be done carefully. You will need to wait until the numbness is entirely gone and the strength has returned. You should be able to move your leg and foot normally before you try and bear weight or walk. You will need someone to be with you when you first try to ensure you do not fall and possibly risk injury.  4. Bruising and tenderness at the needle site are common side effects and will resolve in a few days.  5. Persistent numbness or new problems with movement should be communicated to the surgeon or the [facility] [phone] [facility] [phone]).

## 2021-03-16 NOTE — Anesthesia Postprocedure Evaluation (Signed)
Anesthesia Post Note  Patient: Ronald Swanson  Procedure(s) Performed: LEFT SCAPHOLUNATE LIGAMENT RECONSTRUCTION (Left: Thumb)     Patient location during evaluation: PACU Anesthesia Type: Regional and MAC Level of consciousness: awake and alert Pain management: pain level controlled Vital Signs Assessment: post-procedure vital signs reviewed and stable Respiratory status: spontaneous breathing, nonlabored ventilation, respiratory function stable and patient connected to nasal cannula oxygen Cardiovascular status: stable and blood pressure returned to baseline Postop Assessment: no apparent nausea or vomiting Anesthetic complications: no   No notable events documented.  Last Vitals:  Vitals:   03/16/21 1654 03/16/21 1706  BP: (!) 141/74 (!) 142/83  Pulse: 74 70  Resp: 15 16  Temp:  (!) 36.2 C  SpO2: 97% 97%    Last Pain:  Vitals:   03/16/21 1706  TempSrc:   PainSc: 0-No pain                 Belenda Cruise P Ken Bonn

## 2021-03-16 NOTE — Anesthesia Procedure Notes (Signed)
Anesthesia Regional Block: Supraclavicular block   Pre-Anesthetic Checklist: , timeout performed,  Correct Patient, Correct Site, Correct Laterality,  Correct Procedure, Correct Position, site marked,  Risks and benefits discussed,  Surgical consent,  Pre-op evaluation,  At surgeon's request and post-op pain management  Laterality: Left  Prep: Dura Prep       Needles:  Injection technique: Single-shot  Needle Type: Echogenic Stimulator Needle     Needle Length: 5cm  Needle Gauge: 20     Additional Needles:   Procedures:,,,, ultrasound used (permanent image in chart),,    Narrative:  Start time: 03/16/2021 12:40 PM End time: 03/16/2021 12:45 PM Injection made incrementally with aspirations every 5 mL.  Performed by: Personally  Anesthesiologist: Atilano Median, DO  Additional Notes: Patient identified. Risks/Benefits/Options discussed with patient including but not limited to bleeding, infection, nerve damage, failed block, incomplete pain control. Patient expressed understanding and wished to proceed. All questions were answered. Sterile technique was used throughout the entire procedure. Please see nursing notes for vital signs. Aspirated in 5cc intervals with injection for negative confirmation. Patient was given instructions on fall risk and not to get out of bed. All questions and concerns addressed with instructions to call with any issues or inadequate analgesia.

## 2021-03-16 NOTE — Interval H&P Note (Signed)
History and Physical Interval Note:  03/16/2021 1:41 PM  Boston Caughlin  has presented today for surgery, with the diagnosis of Left Scapholunate Ligament Tear.  The various methods of treatment have been discussed with the patient and family. After consideration of risks, benefits and other options for treatment, the patient has consented to  Procedure(s): LEFT SCAPHOLUNATE LIGAMENT REPAIR VERSUS RECONSTRUCTION (Left) as a surgical intervention.  The patient's history has been reviewed, patient examined, no change in status, stable for surgery.  I have reviewed the patient's chart and labs.  Questions were answered to the patient's satisfaction.     Ventura Hollenbeck Franky Reier

## 2021-03-16 NOTE — Brief Op Note (Signed)
03/16/2021  4:39 PM  PATIENT:  Ronald Swanson  30 y.o. male  PRE-OPERATIVE DIAGNOSIS:  Left Scapholunate Ligament Tear  POST-OPERATIVE DIAGNOSIS:  Left Scapholunate Ligament Tear  PROCEDURE:  Procedure(s): LEFT SCAPHOLUNATE LIGAMENT RECONSTRUCTION (Left)  SURGEON:  Surgeon(s) and Role:    * Sherilyn Cooter, MD - Primary  PHYSICIAN ASSISTANT:   ASSISTANTS: none   ANESTHESIA:   Regional + MAC  EBL:  10 mL   BLOOD ADMINISTERED:none  DRAINS: none   LOCAL MEDICATIONS USED:  NONE  SPECIMEN:  No Specimen  DISPOSITION OF SPECIMEN:  N/A  COUNTS:  YES  TOURNIQUET:   Total Tourniquet Time Documented: Upper Arm (Left) - 125 minutes Total: Upper Arm (Left) - 125 minutes   DICTATION: .Viviann Spare Dictation  PLAN OF CARE: Discharge to home after PACU  PATIENT DISPOSITION:  PACU - hemodynamically stable.   Delay start of Pharmacological VTE agent (>24hrs) due to surgical blood loss or risk of bleeding: not applicable

## 2021-03-16 NOTE — Transfer of Care (Signed)
Immediate Anesthesia Transfer of Care Note  Patient: Ronald Swanson  Procedure(s) Performed: LEFT SCAPHOLUNATE LIGAMENT RECONSTRUCTION (Left: Thumb)  Patient Location: PACU  Anesthesia Type:MAC combined with regional for post-op pain  Level of Consciousness: awake, alert  and oriented  Airway & Oxygen Therapy: Patient Spontanous Breathing  Post-op Assessment: Report given to RN and Post -op Vital signs reviewed and stable  Post vital signs: Reviewed and stable  Last Vitals:  Vitals Value Taken Time  BP    Temp    Pulse 77 03/16/21 1641  Resp 22 03/16/21 1641  SpO2 98 % 03/16/21 1641  Vitals shown include unvalidated device data.  Last Pain:  Vitals:   03/16/21 1117  TempSrc: Oral  PainSc: 9       Patients Stated Pain Goal: 5 (22/41/14 6431)  Complications: No notable events documented.

## 2021-03-16 NOTE — Anesthesia Preprocedure Evaluation (Signed)
Anesthesia Evaluation  Patient identified by MRN, date of birth, ID band Patient awake    Reviewed: Allergy & Precautions, NPO status , Patient's Chart, lab work & pertinent test results  Airway Mallampati: I  TM Distance: >3 FB Neck ROM: Full    Dental no notable dental hx.    Pulmonary neg pulmonary ROS,    Pulmonary exam normal        Cardiovascular negative cardio ROS   Rhythm:Regular Rate:Normal     Neuro/Psych negative neurological ROS  negative psych ROS   GI/Hepatic negative GI ROS, Neg liver ROS,   Endo/Other  negative endocrine ROS  Renal/GU negative Renal ROS  negative genitourinary   Musculoskeletal Left hand ligament tear   Abdominal Normal abdominal exam  (+)   Peds  Hematology negative hematology ROS (+)   Anesthesia Other Findings   Reproductive/Obstetrics                             Anesthesia Physical Anesthesia Plan  ASA: 1  Anesthesia Plan: MAC and Regional   Post-op Pain Management:  Regional for Post-op pain   Induction: Intravenous  PONV Risk Score and Plan: 1 and Ondansetron, Dexamethasone, Propofol infusion, Midazolam and Treatment may vary due to age or medical condition  Airway Management Planned: Simple Face Mask, Nasal Cannula and Natural Airway  Additional Equipment: None  Intra-op Plan:   Post-operative Plan:   Informed Consent: I have reviewed the patients History and Physical, chart, labs and discussed the procedure including the risks, benefits and alternatives for the proposed anesthesia with the patient or authorized representative who has indicated his/her understanding and acceptance.     Dental advisory given  Plan Discussed with: CRNA  Anesthesia Plan Comments:         Anesthesia Quick Evaluation

## 2021-03-16 NOTE — Progress Notes (Signed)
Assisted Dr. Greg Stoltzfus with left, ultrasound guided, supraclavicular block. Side rails up, monitors on throughout procedure. See vital signs in flow sheet. Tolerated Procedure well. 

## 2021-03-18 NOTE — Op Note (Signed)
Date of Surgery: 03/18/2021  INDICATIONS: Mr. Knoth is a 30 y.o.-year-old male with left scapholunate ligament injury.  His initial injury seems to have occurred sometime in 02-10-2023 while doing dead lifts.  He reinjured the same wrist in September when he felt a pop doing a dead lift.  Since that time he has had significant dorsal central pain in the area of the SL interval.  He has pain with gripping and extension type activities.  His pain can be as best and a 10.  Is failed conservative management with ice and NSAIDs and bracing.  MRI was notable for a dorsal and interosseous ligament tear.  He had evidence of SL diastases with slight DISI deformity on x-ray.  Risks, benefits, and alternatives to surgery were again discussed with the patient wishing to proceed with surgery.  Informed consent was signed after our discussion.   PREOPERATIVE DIAGNOSIS:  Left scapholunate ligament tear  POSTOPERATIVE DIAGNOSIS: Same.  PROCEDURE:  Left scapholunate ligament reconstruction   SURGEON: Audria Nine, M.D.  ASSIST:   ANESTHESIA:  general  IV FLUIDS AND URINE: See anesthesia.  ESTIMATED BLOOD LOSS: 10 mL.  IMPLANTS:  Implant Name Type Inv. Item Serial No. Manufacturer Lot No. LRB No. Used Action  KIT HAND WRIST LIGAMENT AUG - [id number] Orthopedic Implant KIT HAND WRIST LIGAMENT AUG  [employer] [id number] Left 1 Implanted  ANCHOR DX SWIVELOCK SL 3.5X8.5 - [id number] Anchor ANCHOR DX SWIVELOCK SL 3.5X8.5  [employer] [id number] Left 1 Implanted     DRAINS: None  COMPLICATIONS: see description of procedure.  DESCRIPTION OF PROCEDURE: The patient was met in the preoperative holding area where the surgical site was marked and the consent form was verified.  The patient was then taken to the operating room and transferred to the operating table.  All bony prominences were well padded.  A tourniquet was applied to the left upper arm.  The operative extremity was prepped and draped in the usual and  sterile fashion.  A formal time-out was performed to confirm that this was the correct patient, surgery, side, and site.   A standard dorsal approach to the wrist was performed.  The skin subcutaneous tissue was incised in line with the third metacarpal just ulnar to Lister tubercle.  Blunt dissection was used to develop the appropriate tissue pain with small crossing vessels coagulated with bipolar electrocautery.  Full-thickness skin flaps were raised taking care to avoid injury to a small cutaneous nerves.  The extensor retinaculum and EPL tendon was identified within the third dorsal compartment.  The third dorsal compartment was opened and the EPL tendon was retracted ulnarly.  The interval between the second and fourth dorsal compartments was elevated.  The floor of the dorsal compartment meant was elevated off of the underlying wrist capsule.  An inverted T shaped capsulotomy was performed taking care to avoid injury to the underlying intercarpal ligaments.  The capsulotomy was carried out radial enough to visualize the entire scaphoid.  There is an obvious injury to the dorsal aspect of the scapholunate ligament that appeared to be difficult to repair primarily.  The scaphoid was sitting in a flexed position and the lunate was extended.  Two .045 K wires were inserted at the ulnar base of the lunate and the waist of the scaphoid.  These 2 wires were brought together to reduce the scapholunate interval and bring the scaphoid flexion and lunate out of extension.  This reduction was held with a Kocher clamp.  Fluoroscopy was  then used to identify the points for our drill holes for the anchors.  A wire was placed at the dorsal aspect of the lunate, the proximal pole scaphoid, and the distal pole of the scaphoid.  Fluoroscopy was used to establish that these drill holes would be contained and not involve the articular surface.  With the appropriate K wire position was made a 3 5 drill was used to create the  bony tunnels for anchors.  This was accomplished using the included drill bit with the positive stopped soft tissue protector product provided by [employer].  I then turned my attention towards obtaining a graft.  I used a intratendinous graft from the [employer] tendon.  The graft was 2 mm in width by 10 cm in length.  A whipstitch was placed in both ends of the graft to allow for incorporation into the anchor.  The first limb of the anchor was placed in the proximal pole of the scaphoid.  The ECRB graft as well as a suture tape was inserted and the fork of the 3 5 swivel lock anchor.  This was inserted in the proximal pole of the scaphoid.  The Was then inserted at the previously made tunnel at the lunate again incorporating both the ECRB graft and the suture tape.  The third limb of the construct went from the lunate to the distal pole of the scaphoid to help maintain the scaphoid in a extended position and keep it out of flexion.  All of these suture anchors were placed uneventfully with good bony purchase and the interference screw sitting below the bony surface.  Fluoroscopy confirmed that the scapholunate interval was reduced and the lunate was out of DISI.  Range of motion under live fluoroscopy demonstrated that the scaphoid and lunate moved as a unit.  Happy with our reconstruction the joint was thoroughly irrigated.  The capsulotomy was closed using 3-0 Ethibond sutures.  The retinaculum was closed with 3-0 Ethibond sutures taking care that the EPL tendon remain transposed.  The tourniquet was let down and hemostasis was achieved using bipolar cautery and direct pressure.  The skin was closed using 3-0 Monocryl in a buried erupted fashion followed by 4-0 nylon in horizontal mattress fashion the wound was dressed with Xeroform, 4 x 4's, cast padding and a well-padded thumb spica splint.  The patient was then reversed from anesthesia and extubated uneventfully.  Transferred to the postoperative in stable  condition.  All counts were correct observed in the procedure.  The patient was then taken the PACU in stable condition.  POSTOPERATIVE PLAN: The patient will be discharged home from the postoperative period.  Patient would be discharged with appropriate pain medication and discharge instructions.  Charles Benfield, MD 3:36 PM  

## 2021-03-19 ENCOUNTER — Telehealth: Payer: Self-pay | Admitting: Orthopedic Surgery

## 2021-03-19 ENCOUNTER — Encounter (HOSPITAL_BASED_OUTPATIENT_CLINIC_OR_DEPARTMENT_OTHER): Payer: Self-pay | Admitting: Orthopedic Surgery

## 2021-03-19 NOTE — Telephone Encounter (Signed)
Pt called stating he had surgery on 03/16/21 and the rx he was given for pain hasn't been helping. Pt also stated his bandage is too tight and he wasn't given a debriefing of how surgery went. Pt would like a CB to be advised about his bandage and to see when Dr. Frazier Butt wants to see him again.

## 2021-03-29 ENCOUNTER — Ambulatory Visit (INDEPENDENT_AMBULATORY_CARE_PROVIDER_SITE_OTHER): Payer: 59 | Admitting: Orthopedic Surgery

## 2021-03-29 ENCOUNTER — Other Ambulatory Visit: Payer: Self-pay

## 2021-03-29 ENCOUNTER — Encounter: Payer: Self-pay | Admitting: Orthopedic Surgery

## 2021-03-29 VITALS — BP 138/82 | HR 72

## 2021-03-29 DIAGNOSIS — S63599A Other specified sprain of unspecified wrist, initial encounter: Secondary | ICD-10-CM

## 2021-03-29 NOTE — Progress Notes (Signed)
   Post-Op Visit Note   Patient: Ronald Swanson           Date of Birth: 11/06/1990           MRN: [id number] Visit Date: 03/29/2021 PCP: Morrell Riddle, PA-C   Assessment & Plan:  Chief Complaint:  Chief Complaint  Patient presents with   Left Wrist - Routine Post Op   Visit Diagnoses:  1. Complete tear of scapholunate ligament     Plan: He is doing well postoperatively.  His sutures were removed today.  His incision is clean and dry w/ no surrounding erythema or induration.  The pin site is clean and dry.  We will transition him to a thumb spica cast with the IP joint free.  I can see him back in a month to remove the k-wire.  We again discussed the lengthy rehab process involved with this injury.    Follow-Up Instructions: No follow-ups on file.   Orders:  No orders of the defined types were placed in this encounter.  No orders of the defined types were placed in this encounter.   Imaging: No results found.  PMFS History: Patient Active Problem List   Diagnosis Date Noted   Complete tear of scapholunate ligament    Left scapholunate ligament tear 03/05/2021   Chronic pain of left wrist 02/26/2021   History reviewed. No pertinent past medical history.  History reviewed. No pertinent family history.  Past Surgical History:  Procedure Laterality Date   LIGAMENT REPAIR Left 03/16/2021   Procedure: LEFT SCAPHOLUNATE LIGAMENT RECONSTRUCTION;  Surgeon: Marlyne Beards, MD;  Location: [facility];  Service: Orthopedics;  Laterality: Left;   Social History   Occupational History   Not on file  Tobacco Use   Smoking status: Never   Smokeless tobacco: Never  Vaping Use   Vaping Use: Never used  Substance and Sexual Activity   Alcohol use: Yes   Drug use: Yes    Types: Marijuana    Comment: occasionally   Sexual activity: Not on file

## 2021-04-12 ENCOUNTER — Telehealth: Payer: Self-pay | Admitting: Orthopedic Surgery

## 2021-04-12 NOTE — Telephone Encounter (Signed)
Patient wants to know if he needs to have his cast re-applied. Cast got wet in rain yesterday and patient states it feels moist inside. He wants to know if he need to come in to be seen and will he be charged for another office visit. Cast is scheduled to be removed 12/19.

## 2021-04-12 NOTE — Telephone Encounter (Signed)
FYI-I called and talked to the pt. He cant come in today. He said he can come in the morning. Nurse visit appt. Scheduled

## 2021-04-12 NOTE — Telephone Encounter (Signed)
Does Dr. Frazier Butt want to see him for this?

## 2021-04-13 ENCOUNTER — Ambulatory Visit: Payer: 59

## 2021-04-13 ENCOUNTER — Other Ambulatory Visit: Payer: Self-pay

## 2021-04-30 ENCOUNTER — Other Ambulatory Visit: Payer: Self-pay

## 2021-04-30 ENCOUNTER — Ambulatory Visit (INDEPENDENT_AMBULATORY_CARE_PROVIDER_SITE_OTHER): Payer: 59 | Admitting: Orthopedic Surgery

## 2021-04-30 ENCOUNTER — Ambulatory Visit (INDEPENDENT_AMBULATORY_CARE_PROVIDER_SITE_OTHER): Payer: 59

## 2021-04-30 ENCOUNTER — Encounter: Payer: Self-pay | Admitting: Orthopedic Surgery

## 2021-04-30 DIAGNOSIS — S63592A Other specified sprain of left wrist, initial encounter: Secondary | ICD-10-CM

## 2021-04-30 DIAGNOSIS — S63599A Other specified sprain of unspecified wrist, initial encounter: Secondary | ICD-10-CM

## 2021-04-30 NOTE — Progress Notes (Signed)
° °  Post-Op Visit Note   Patient: Ronald Swanson           Date of Birth: 1990-11-13           MRN: [id number] Visit Date: 04/30/2021 PCP: Morrell Riddle, PA-C   Assessment & Plan:  Chief Complaint:  Chief Complaint  Patient presents with   Left Wrist - Follow-up   Visit Diagnoses:  1. Complete tear of scapholunate ligament     Plan: Patient continues to do well postoperatively.  Pin removed today.  X-rays today demonstrate maintained SL approximation.  Will start hand therapy to have custom orthosis made and start with postop SL reconstruction protocol.  I can see him back in another month.   Follow-Up Instructions: No follow-ups on file.   Orders:  Orders Placed This Encounter  Procedures   XR Wrist 2 Views Left   No orders of the defined types were placed in this encounter.   Imaging: No results found.  PMFS History: Patient Active Problem List   Diagnosis Date Noted   Complete tear of scapholunate ligament    Left scapholunate ligament tear 03/05/2021   Chronic pain of left wrist 02/26/2021   History reviewed. No pertinent past medical history.  History reviewed. No pertinent family history.  Past Surgical History:  Procedure Laterality Date   LIGAMENT REPAIR Left 03/16/2021   Procedure: LEFT SCAPHOLUNATE LIGAMENT RECONSTRUCTION;  Surgeon: Marlyne Beards, MD;  Location: [facility];  Service: Orthopedics;  Laterality: Left;   Social History   Occupational History   Not on file  Tobacco Use   Smoking status: Never   Smokeless tobacco: Never  Vaping Use   Vaping Use: Never used  Substance and Sexual Activity   Alcohol use: Yes   Drug use: Yes    Types: Marijuana    Comment: occasionally   Sexual activity: Not on file

## 2021-05-02 ENCOUNTER — Ambulatory Visit: Payer: 59 | Attending: Orthopedic Surgery | Admitting: Occupational Therapy

## 2021-05-02 ENCOUNTER — Encounter: Payer: Self-pay | Admitting: Occupational Therapy

## 2021-05-02 ENCOUNTER — Encounter: Payer: 59 | Admitting: Occupational Therapy

## 2021-05-02 ENCOUNTER — Other Ambulatory Visit: Payer: Self-pay

## 2021-05-02 DIAGNOSIS — M25632 Stiffness of left wrist, not elsewhere classified: Secondary | ICD-10-CM | POA: Insufficient documentation

## 2021-05-02 DIAGNOSIS — R600 Localized edema: Secondary | ICD-10-CM | POA: Insufficient documentation

## 2021-05-02 DIAGNOSIS — M25532 Pain in left wrist: Secondary | ICD-10-CM | POA: Diagnosis not present

## 2021-05-02 DIAGNOSIS — R29898 Other symptoms and signs involving the musculoskeletal system: Secondary | ICD-10-CM | POA: Insufficient documentation

## 2021-05-02 DIAGNOSIS — M6281 Muscle weakness (generalized): Secondary | ICD-10-CM | POA: Insufficient documentation

## 2021-05-02 NOTE — Patient Instructions (Signed)
Scar Tissue Massage    Place pad of fingertip on scar area. Apply steady downward pressure while moving in circular fashion. Use another finger on top to assist. Repeat until entire scar has been covered. Repeat 3 times/day; about 3 minutes each session.  Copyright  VHI. All rights reserved.

## 2021-05-04 NOTE — Therapy (Signed)
[facility] [address] [place], [place], [address] Phone: [phone]   Fax:  [phone]  Occupational Therapy Evaluation  Patient Details  Name: Ronald Swanson MRN: [id number] Date of Birth: 1990/06/29 Referring Provider (OT): Park Liter, MD (Orthopedic Surgery)   Encounter Date: 05/02/2021    OT End of Session - 05/02/21 1542     Visit Number 1   Number of Visits 13   Date for OT Re-Evaluation 07/31/21   Authorization Type UMR   Authorization Time Period VL: 25 then authorization required   OT Start Time 1530   OT Stop Time 1725   OT Time Calculation (min) 115 min   Activity Tolerance Patient tolerated treatment well   Behavior During Therapy [facility] for tasks assessed/performed           History reviewed. No pertinent past medical history.  Past Surgical History:  Procedure Laterality Date   LIGAMENT REPAIR Left 03/16/2021   Procedure: LEFT SCAPHOLUNATE LIGAMENT RECONSTRUCTION;  Surgeon: Marlyne Beards, MD;  Location: [facility];  Service: Orthopedics;  Laterality: Left;    There were no vitals filed for this visit.   Subjective Assessment - 05/02/21 1533    Subjective Pt arrives to session w/ primary concern regarding regaining strength and functional use of his LUE s/p scapholunate tear and ligament reconstruction sx, particularly so he is able to return to working out and weight lifting. Pt reports his pain started in September, aggravating it both when helping a friend move and when completing a dead lift exercise during which he felt a "pop." He experienced intermittent but worsening pain in his L wrist before ultimately presenting to the ED on 02/09/21. Considering his age and activity level, pt opted to treat condition operatively and underwent SLL reconstruction surgery on 03/16/21.   Pertinent History Left scapholunate ligament reconstruction 03/16/21 s/p complete SLL tear   Patient Stated  Goals Get back to the gym/weight lifting   Currently in Pain? Yes   Pain Score 6   Pain Location Wrist   Pain Orientation Left   Pain Descriptors / Indicators Aching   Pain Type Acute pain; Surgical pain   Aggravating Factors Moving wrong, hitting his hand accidentally   Pain Relieving Factors OTC medication prn            [facility] OT Assessment - 05/02/21 1757       Assessment   Medical Diagnosis SLL reconstruction s/p complete ligament tear    Referring Provider (OT) Park Liter, MD   Orthopedic Surgery   Onset Date/Surgical Date 03/16/21   Date of sx; injury onset 01/2021   Hand Dominance Right    Next MD Visit 05/17/20      Precautions   Precautions Other (comment)    Precaution Comments No lifting/weight bearing    Required Braces or Orthoses Other Brace/Splint    Other Brace/Splint Wrist and thumb static immobilization orthosis      Restrictions   Weight Bearing Restrictions No      Prior Function   Level of Independence Independent    Vocation Full time employment    Vocation Requirements [employer]; works from home and reports not significant difficulties w/ work tasks at this time    Leisure Going to the gym      ADL   Eating/Feeding Needs assist with cutting food    Grooming Modified independent    Upper Body Bathing Modified independent    Lower Body Bathing Modified  independent    Upper Body Dressing Increased time   Occasional difficulty w/ fasteners   Lower Body Dressing Increased time   Occasional difficulty w/ fasteners     Observation/Other Assessments   Observations Incision site appears to be healing well; no signs of infection observed    Quick DASH  TBA      Sensation   Additional Comments Denies numbness, tingling/pins and needles      ROM / Strength   AROM / PROM / Strength AROM;Strength      AROM   Overall AROM  Deficits;Unable to assess;Due to precautions      Strength   Overall Strength Deficits;Unable to assess;Due to  precautions             OT Treatments/Exercises (OP) - 05/02/21 1809       Splinting   Splinting Fabricated and fitted forearm-based circumferential (ulnar side open) wrist and thumb static orthosis using 1/8" solid material for increased support. Pt positioned in approx 10 degrees of wrist extension and deviation in neutral w/ thumb midway between palmar and radial abduction and IPJ free; able to oppose thumb to index finger. OT remolded orthosis once due to slight ulnar deviation. All edged flared and smoothed. Pt educated on wear and care of orthosis and cautioned to monitor skin pressure areas; pt verbalized understanding and handout was signed and provided to pt at conclusion of session.            OT Education - 05/02/21 1815     Education Details Education provided on role and purpose of OT, as well as potential interventions and goals for therapy based on SL reconstruction protocol. Also reviewed scar massage and wear and care of orthosis (see 'Treatment/Exercises' above).    Person(s) Educated Patient    Methods Explanation;Demonstration;Handout    Comprehension Verbalized understanding             OT Short Term Goals - 05/02/21 1841       OT SHORT TERM GOAL #1   Title Pt will verbalize understanding of wear and care of custom-fit orthosis    Time 1    Period Days    Status Achieved   05/02/21 - document signed and administered   Target Date 05/02/21      OT SHORT TERM GOAL #2   Title Pt will be able to achieve mid-range AROM of wrist flexion/extension w/ discomfort less than 4/10    Baseline TBA    Time 2    Period Weeks    Status New    Target Date 05/18/21             OT Long Term Goals - 05/02/21 1845       OT LONG TERM GOAL #1   Title Pt will demonstrate independence w/ HEP designed for strength and ROM by d/c    Baseline No HEP at this time    Time 6    Period Weeks    Status New    Target Date 06/15/21      OT LONG TERM GOAL #2   Title  Pt will achieve at least 75% of wrist flexion/extension ROM compared to R wrist to facilitate eventual return to weight-lifting activities    Baseline TBA    Time 6    Period Weeks    Status New    Target Date 06/15/21      OT LONG TERM GOAL #3   Title Pt will demonstrate L hand grip strength  of at least 20 lbs by d/c    Baseline Unable to assess due to precautions    Time 6    Period Weeks    Status New    Target Date 06/15/21      OT LONG TERM GOAL #4   Title Pt will demonstrate ROM of L thumb WFL compared to R thumb    Baseline TBA    Time 6    Period Weeks    Status New    Target Date 06/15/21      OT LONG TERM GOAL #5   Title Pt will improve functional use of LUE as evidenced by increasing QuickDASH score by at least 10 pts by d/c    Baseline TBA    Time 6    Period Weeks    Status New    Target Date 06/15/21             Plan - 05/02/21 1831     Clinical Impression Statement Pt is a 30 y.o. male who presents to OP OT almost 7 weeks s/p SLL reconstruction on 03/16/21. Pt was referred by Dr. Frazier Butt for fabrication of custom wrist and thumb static orthosis w/ IPJ free to prevent movement, protect, and facilitate healing for several weeks. Pt is to start postop SL reconstruction protocol pending MD approval to initiate mid-range AROM. After fabrication of orthosis, pt will benefit from skilled occupational therapy services to address strength and coordination, ROM, pain management, and implementation of an HEP to improve participation in ADLs and leisure pursuits.    OT Occupational Profile and History Problem Focused Assessment - Including review of records relating to presenting problem    Occupational performance deficits (Please refer to evaluation for details): ADL's;IADL's;Rest and Sleep;Work;Leisure    Body Structure / Function / Physical Skills Decreased knowledge of precautions;ROM;UE functional use;Scar mobility;Body  mechanics;Dexterity;Edema;GMC;Endurance;Pain;Strength    Rehab Potential Excellent    Clinical Decision Making Several treatment options, min-mod task modification necessary    Comorbidities Affecting Occupational Performance: None    Modification or Assistance to Complete Evaluation  Min-Moderate modification of tasks or assist with assess necessary to complete eval    OT Frequency 2x / week    OT Duration 6 weeks    OT Treatment/Interventions Self-care/ADL training;Electrical Stimulation;Iontophoresis;Therapeutic exercise;Moist Heat;Paraffin;Compression bandaging;Splinting;Patient/family education;Fluidtherapy;Scar mobilization;Therapeutic activities;Cryotherapy;Ultrasound;DME and/or AE instruction;Manual Therapy;Passive range of motion    Plan Orthosis check; confirm w/ MD pt is able to initiate scar massage and/or ROM -- starting w/ mid-range AROM of thumb and wrist flex/ext and rad/ulnar deviation    Consulted and Agree with Plan of Care Patient            Patient will benefit from skilled therapeutic intervention in order to improve the following deficits and impairments:   Body Structure / Function / Physical Skills: Decreased knowledge of precautions, ROM, UE functional use, Scar mobility, Body mechanics, Dexterity, Edema, GMC, Endurance, Pain, Strength   Visit Diagnosis: Pain in left wrist  Stiffness of left wrist, not elsewhere classified  Localized edema  Muscle weakness (generalized)  Other symptoms and signs involving the musculoskeletal system   Problem List Patient Active Problem List   Diagnosis Date Noted   Complete tear of scapholunate ligament    Left scapholunate ligament tear 03/05/2021   Chronic pain of left wrist 02/26/2021    Rosie Fate, OTR/L, MSOT 05/02/2021, 6:11 PM   ADDENDUM - 05/03/2021 Pt called to report some tenderness along ulnar styloid after sleeping in orthosis overnight. Pt arrived  for orthosis check at 12:00 PM w/ OT remolding area  of ulnar styloid, building up area to allow for additional padding w/out increasing pressure. Also, per pt's report, MD cleared him to don/doff orthosis for hand hygiene and scar massage prn. OT also trimmed ulnar borders of orthosis in attempt to improve ease of donning/doffing orthosis. Pt continued to demonstrate difficulty w/ OT unable to trim ulnar border further to avoid decreasing support at wrist level; pt confirmed his girlfriend will be able to assist him w/ donning/doffing orthosis for hand hygiene and scar massage prn.  Rosie Fate, OTR/L, MSOT 05/03/2021, 12:43 PM   [facility] [address] [place], [place], [address] Phone: [phone]   Fax:  [phone]  Name: Ronald Swanson MRN: [id number] Date of Birth: [birth year]

## 2021-05-09 ENCOUNTER — Ambulatory Visit: Payer: 59 | Admitting: Occupational Therapy

## 2021-05-10 ENCOUNTER — Encounter: Payer: Self-pay | Admitting: Occupational Therapy

## 2021-05-10 ENCOUNTER — Ambulatory Visit: Payer: 59 | Admitting: Occupational Therapy

## 2021-05-10 ENCOUNTER — Other Ambulatory Visit: Payer: Self-pay

## 2021-05-10 DIAGNOSIS — M25632 Stiffness of left wrist, not elsewhere classified: Secondary | ICD-10-CM | POA: Diagnosis not present

## 2021-05-10 DIAGNOSIS — R29898 Other symptoms and signs involving the musculoskeletal system: Secondary | ICD-10-CM | POA: Diagnosis not present

## 2021-05-10 DIAGNOSIS — R600 Localized edema: Secondary | ICD-10-CM

## 2021-05-10 DIAGNOSIS — M25532 Pain in left wrist: Secondary | ICD-10-CM | POA: Diagnosis not present

## 2021-05-10 DIAGNOSIS — M6281 Muscle weakness (generalized): Secondary | ICD-10-CM

## 2021-05-11 ENCOUNTER — Encounter: Payer: Self-pay | Admitting: Occupational Therapy

## 2021-05-11 NOTE — Therapy (Signed)
[facility] [address] [place], [place], [address] Phone: [phone]   Fax:  [phone]  Occupational Therapy Treatment  Patient Details  Name: Ronald Swanson MRN: [id number] Date of Birth: [birth year] Referring Provider (OT): Park Liter, MD (Orthopedic Surgery)   Encounter Date: 05/10/2021   OT End of Session - 05/10/21 1632     Visit Number 2    Number of Visits 13    Date for OT Re-Evaluation 07/31/21    Authorization Type UMR    Authorization Time Period VL: 25 then authorization required    OT Start Time 1615    OT Stop Time 1700    OT Time Calculation (min) 45 min    Activity Tolerance Patient tolerated treatment well    Behavior During Therapy [facility] for tasks assessed/performed            History reviewed. No pertinent past medical history.  Past Surgical History:  Procedure Laterality Date   LIGAMENT REPAIR Left 03/16/2021   Procedure: LEFT SCAPHOLUNATE LIGAMENT RECONSTRUCTION;  Surgeon: Marlyne Beards, MD;  Location: [facility];  Service: Orthopedics;  Laterality: Left;    There were no vitals filed for this visit.   Subjective Assessment - 05/10/21 1617     Subjective  Pt reports he noticed a little redness on Tuesday and is unsure whether it is something he should be concerned about    Pertinent History Left scapholunate ligament reconstruction 03/16/21 s/p complete SLL tear    Patient Stated Goals Get back to the gym/weight lifting    Currently in Pain? Yes    Pain Score 3     Pain Location Wrist    Pain Orientation Left    Pain Descriptors / Indicators Aching;Other (Comment)   "Pulling"            OT Treatments/Exercises (OP) - 05/10/21 1732       Wrist Exercises   Other wrist exercises Midrange AROM wrist radial and ulnar deviation completed 20x slowly w/ forearm pronated and resting on tabletop. OT provided initial tactile/visual cue to ensure midrange arc of movement  w/ pt able to maintain appropriate range w/out cue. Pt demonstrated good control of movement and reported mild discomfort/pulling, but no pain.    Other wrist exercises Midrange AROM of wrist flexion and extension completed 20x slowly w/ forearm resting on tabletop in neutral. OT provided initial tactile/visual cue to ensure midrange arc of movement w/ pt able to maintain appropriate range w/out cue. Pt demonstrated good control of movement and reported mild discomfort/pulling, but no pain.      Hand Exercises   Other Hand Exercises Midrange AROM of thumb composite flexion/extension completed 20x slowly w/ forearm resting on tabletop in neutral. Pt demonstrated good control of movement and reported mild discomfort/pulling, but no pain.             OT Education - 05/10/21 1631     Education Details Continued condition-specific education and initiated HEP (see 'OT HEP' below)    Person(s) Educated Patient    Methods Explanation;Demonstration;Handout    Comprehension Verbalized understanding;Returned demonstration             OT Short Term Goals - 05/10/21 1707       OT SHORT TERM GOAL #1   Title Pt will verbalize understanding of wear and care of custom-fit orthosis    Time 1    Period Days    Status Achieved   05/02/21 - document  signed and administered   Target Date 05/02/21      OT SHORT TERM GOAL #2   Title Pt will be able to achieve mid-range AROM of wrist flexion/extension w/ discomfort less than 4/10    Baseline TBA    Time 2    Period Weeks    Status On-going    Target Date 05/18/21             OT Long Term Goals - 05/10/21 1708       OT LONG TERM GOAL #1   Title Pt will demonstrate independence w/ HEP designed for strength and ROM by d/c    Baseline No HEP at this time    Time 6    Period Weeks    Status On-going    Target Date 06/15/21      OT LONG TERM GOAL #2   Title Pt will achieve at least 75% of wrist flexion/extension ROM compared to R wrist to  facilitate eventual return to weight-lifting activities    Baseline TBA    Time 6    Period Weeks    Status On-going    Target Date 06/15/21      OT LONG TERM GOAL #3   Title Pt will demonstrate L hand grip strength of at least 20 lbs by d/c    Baseline Unable to assess due to precautions    Time 6    Period Weeks    Status On-going    Target Date 06/15/21      OT LONG TERM GOAL #4   Title Pt will demonstrate ROM of L thumb WFL compared to R thumb    Baseline TBA    Time 6    Period Weeks    Status On-going    Target Date 06/15/21      OT LONG TERM GOAL #5   Title Pt will improve functional use of LUE as evidenced by increasing QuickDASH score by at least 10 pts by d/c    Baseline TBA    Time 6    Period Weeks    Status On-going    Target Date 06/15/21             Plan - 05/10/21 1708     Clinical Impression Statement Pt returns to OT 8 weeks s/p SLL reconstruction on 03/16/21; no concerns related to custom-fabricated orthosis at this time and reports he has been compliant w/ scar massage. Per protocol, OT initiated HEP this session, starting w/ gentle, mid-range AROM of thumb flexion/extension, wrist flexion/extension, and wrist radial and ulnar deviation. OT also provided education related to completing exercises within a tolerable level of discomfort, but not pain, and staying within mid-range of arc of movement at this time. Pt did well w/ exercises and printed MedBridge handout was administered w/ written notes included prn. OT also discussed mild edema and redness observed and reported, recommending pt reach out to his physician to determine if he needs to return for his follow-up visit prior to scheduled appt next week, as well as to be watchful for development of new symptoms or worsening of current symptoms. Pt denied additional signs of concern (fever, temperature changes, increased pain, etc.) and did report straining his hand/wrist the other day attempting to help a  family member, which he believes may have caused irritation.    OT Occupational Profile and History Problem Focused Assessment - Including review of records relating to presenting problem    Occupational performance deficits (Please refer  to evaluation for details): ADL's;IADL's;Rest and Sleep;Work;Leisure    Body Structure / Function / Physical Skills Decreased knowledge of precautions;ROM;UE functional use;Scar mobility;Body mechanics;Dexterity;Edema;GMC;Endurance;Pain;Strength    Rehab Potential Excellent    Clinical Decision Making Several treatment options, min-mod task modification necessary    Comorbidities Affecting Occupational Performance: None    Modification or Assistance to Complete Evaluation  Min-Moderate modification of tasks or assist with assess necessary to complete eval    OT Frequency 2x / week    OT Duration 6 weeks    OT Treatment/Interventions Self-care/ADL training;Electrical Stimulation;Iontophoresis;Therapeutic exercise;Moist Heat;Paraffin;Compression bandaging;Splinting;Patient/family education;Fluidtherapy;Scar mobilization;Therapeutic activities;Cryotherapy;Ultrasound;DME and/or AE instruction;Manual Therapy;Passive range of motion    Plan Review mid-range AROM; initiate AAROM as appropriate; reduce orthosis to wrist-only prn    Consulted and Agree with Plan of Care Patient            Patient will benefit from skilled therapeutic intervention in order to improve the following deficits and impairments:   Body Structure / Function / Physical Skills: Decreased knowledge of precautions, ROM, UE functional use, Scar mobility, Body mechanics, Dexterity, Edema, GMC, Endurance, Pain, Strength   Visit Diagnosis: Pain in left wrist  Stiffness of left wrist, not elsewhere classified  Localized edema  Muscle weakness (generalized)  Other symptoms and signs involving the musculoskeletal system   Problem List Patient Active Problem List   Diagnosis Date Noted    Complete tear of scapholunate ligament    Left scapholunate ligament tear 03/05/2021   Chronic pain of left wrist 02/26/2021    Rosie Fate, MSOT, OTR/L  05/10/2021, 5:43 PM  [facility] [address] [place], [place], [address] Phone: [phone]   Fax:  [phone]  Name: Ronald Swanson MRN: [id number] Date of Birth: [birth year]

## 2021-05-15 DIAGNOSIS — F524 Premature ejaculation: Secondary | ICD-10-CM | POA: Diagnosis not present

## 2021-05-16 ENCOUNTER — Ambulatory Visit: Payer: 59 | Admitting: Occupational Therapy

## 2021-05-17 ENCOUNTER — Ambulatory Visit (INDEPENDENT_AMBULATORY_CARE_PROVIDER_SITE_OTHER): Payer: 59 | Admitting: Orthopedic Surgery

## 2021-05-17 ENCOUNTER — Encounter: Payer: Self-pay | Admitting: Occupational Therapy

## 2021-05-17 ENCOUNTER — Ambulatory Visit: Payer: 59 | Attending: Orthopedic Surgery | Admitting: Occupational Therapy

## 2021-05-17 ENCOUNTER — Other Ambulatory Visit: Payer: Self-pay

## 2021-05-17 ENCOUNTER — Encounter: Payer: Self-pay | Admitting: Orthopedic Surgery

## 2021-05-17 VITALS — BP 130/80 | HR 70

## 2021-05-17 DIAGNOSIS — R29898 Other symptoms and signs involving the musculoskeletal system: Secondary | ICD-10-CM | POA: Insufficient documentation

## 2021-05-17 DIAGNOSIS — M25532 Pain in left wrist: Secondary | ICD-10-CM | POA: Insufficient documentation

## 2021-05-17 DIAGNOSIS — M25632 Stiffness of left wrist, not elsewhere classified: Secondary | ICD-10-CM | POA: Diagnosis not present

## 2021-05-17 DIAGNOSIS — M6281 Muscle weakness (generalized): Secondary | ICD-10-CM | POA: Diagnosis not present

## 2021-05-17 DIAGNOSIS — R600 Localized edema: Secondary | ICD-10-CM | POA: Insufficient documentation

## 2021-05-17 DIAGNOSIS — S63599A Other specified sprain of unspecified wrist, initial encounter: Secondary | ICD-10-CM

## 2021-05-17 NOTE — Progress Notes (Signed)
° °  Post-Op Visit Note   Patient: Ronald Swanson           Date of Birth: [birth year]           MRN: [id number] Visit Date: 05/17/2021 PCP: Mancel Bale, PA-C   Assessment & Plan:  Chief Complaint:  Chief Complaint  Patient presents with   Left Wrist - Post-op Follow-up   Visit Diagnoses:  1. Complete tear of scapholunate ligament     Plan: Patient is now 9 weeks out s/p left SL reconstruction.  He is doing well postoperatively.  He is wearing his splint and working with therapy on regaining motion.  He has no wrist pain at the point.  He is able to make a complete fist without discomfort.  I'll see him back in another month to see how he's progressing with therapy.   Follow-Up Instructions: No follow-ups on file.   Orders:  No orders of the defined types were placed in this encounter.  No orders of the defined types were placed in this encounter.   Imaging: No results found.  PMFS History: Patient Active Problem List   Diagnosis Date Noted   Complete tear of scapholunate ligament    Left scapholunate ligament tear 03/05/2021   Chronic pain of left wrist 02/26/2021   History reviewed. No pertinent past medical history.  History reviewed. No pertinent family history.  Past Surgical History:  Procedure Laterality Date   LIGAMENT REPAIR Left 03/16/2021   Procedure: LEFT SCAPHOLUNATE LIGAMENT RECONSTRUCTION;  Surgeon: Sherilyn Cooter, MD;  Location: [place];  Service: Orthopedics;  Laterality: Left;   Social History   Occupational History   Not on file  Tobacco Use   Smoking status: Never   Smokeless tobacco: Never  Vaping Use   Vaping Use: Never used  Substance and Sexual Activity   Alcohol use: Yes   Drug use: Yes    Types: Marijuana    Comment: occasionally   Sexual activity: Not on file

## 2021-05-18 ENCOUNTER — Ambulatory Visit: Payer: 59 | Admitting: Occupational Therapy

## 2021-05-18 DIAGNOSIS — R29898 Other symptoms and signs involving the musculoskeletal system: Secondary | ICD-10-CM | POA: Diagnosis not present

## 2021-05-18 DIAGNOSIS — R600 Localized edema: Secondary | ICD-10-CM

## 2021-05-18 DIAGNOSIS — M25632 Stiffness of left wrist, not elsewhere classified: Secondary | ICD-10-CM | POA: Diagnosis not present

## 2021-05-18 DIAGNOSIS — M6281 Muscle weakness (generalized): Secondary | ICD-10-CM

## 2021-05-18 DIAGNOSIS — M25532 Pain in left wrist: Secondary | ICD-10-CM | POA: Diagnosis not present

## 2021-05-18 NOTE — Therapy (Signed)
[facility] [address] [place], [place], [address] Phone: [phone]   Fax:  [phone]  Occupational Therapy Treatment  Patient Details  Name: Ronald Swanson MRN: [id number] Date of Birth: 06-May-1991 Referring Provider (OT): Park Liter, MD (Orthopedic Surgery)   Encounter Date: 05/18/2021   OT End of Session - 05/18/21 1109     Visit Number 4    Number of Visits 13    Date for OT Re-Evaluation 07/31/21    Authorization Type UMR    Authorization Time Period VL: 25 then authorization required    Authorization - Visit Number 2    Authorization - Number of Visits 25    OT Start Time 1105    OT Stop Time 1221    OT Time Calculation (min) 76 min    Activity Tolerance Patient tolerated treatment well    Behavior During Therapy [facility] for tasks assessed/performed            No past medical history on file.  Past Surgical History:  Procedure Laterality Date   LIGAMENT REPAIR Left 03/16/2021   Procedure: LEFT SCAPHOLUNATE LIGAMENT RECONSTRUCTION;  Surgeon: Marlyne Beards, MD;  Location: [facility];  Service: Orthopedics;  Laterality: Left;    There were no vitals filed for this visit.   Subjective Assessment - 05/18/21 1105     Subjective  Pt reports his wrist has been a little uncomfortable today and he had to take an OTC pain reliever; states he slept without his orthosis on last night    Pertinent History Left scapholunate ligament reconstruction 03/16/21 s/p complete SLL tear    Patient Stated Goals Get back to the gym/weight lifting    Currently in Pain? Yes    Pain Score 6     Pain Location Wrist    Pain Orientation Left    Pain Descriptors / Indicators Throbbing    Pain Type Acute pain    Pain Onset More than a month ago    Pain Frequency Intermittent             Treatment/Exercises - 05/18/21    Orthosis Management Fabricated and fitted thumb-hole wrist immobilization orthosis  extending along dorsal and palmar forearm for increased support using 1/8" perforated material. Pt positioned w/ wrist in slight extension and both MPJs and thumb free; pt able to oppose thumb to index finger. Soft padding added to ulnar styloid and thumb webspace to prevent irritation. All edges smoothed and rounded w/ additional strapping and stockinette administered to pt.    Wrist Exercises AAROM of L wrist radial/ulnar deviation rolling small unweighted ball forward and back w/ palms on the ball/forearms in neutral; OT provided modeling and breakdown of task to facilitate understanding of positioning to maintain wrist in neutral (I.e., prevent composite wrist extension w/ radial/ulnar deviation). Able to complete multiple sets w/out increased pain.  AAROM of L wrist flexion/extension w/ small unweighted ball, gently rolling forward and back; OT provided initial verbal cue for positioning w/ pt able to complete exercise w/out increased pain/adverse reaction            OT Education - 05/18/21 1221     Education Details Continued condition-specific education and reviewed orthosis wear and care    Person(s) Educated Patient    Methods Explanation;Demonstration    Comprehension Verbalized understanding;Returned demonstration             OT Short Term Goals - 05/10/21 1707  OT SHORT TERM GOAL #1   Title Pt will verbalize understanding of wear and care of custom-fit orthosis    Time 1    Period Days    Status Achieved   05/02/21 - document signed and administered   Target Date 05/02/21      OT SHORT TERM GOAL #2   Title Pt will be able to achieve mid-range AROM of wrist flexion/extension w/ discomfort less than 4/10    Baseline TBA    Time 2    Period Weeks    Status On-going    Target Date 05/18/21             OT Long Term Goals - 05/17/21 1745       OT LONG TERM GOAL #1   Title Pt will demonstrate independence w/ HEP designed for strength and ROM by d/c     Baseline No HEP at this time    Time 6    Period Weeks    Status On-going    Target Date 06/15/21      OT LONG TERM GOAL #2   Title Pt will achieve at least 75% of wrist flexion/extension ROM compared to R wrist to facilitate eventual return to weight-lifting activities    Baseline R wrist flexion/extension 65 and 67* respectively; L wrist flex/ext goal ~50*    Time 6    Period Weeks    Status On-going    Target Date 06/15/21      OT LONG TERM GOAL #3   Title Pt will demonstrate L hand grip strength of at least 20 lbs by d/c    Baseline Unable to assess due to precautions    Time 6    Period Weeks    Status On-going    Target Date 06/15/21      OT LONG TERM GOAL #4   Title Pt will demonstrate ROM of L thumb WFL compared to R thumb    Baseline L thumb MCP flexion 55* (R thumb 83*)    Time 6    Period Weeks    Status On-going    Target Date 06/15/21      OT LONG TERM GOAL #5   Title Pt will improve functional use of LUE as evidenced by increasing QuickDASH score by at least 10 pts by d/c    Baseline TBA    Time 6    Period Weeks    Status On-going    Target Date 06/15/21             Plan - 05/18/21 1227     Clinical Impression Statement Pt is currently 9 weeks s/p SLL reconstruction w/ Dr. Frazier ButtBenfield on 03/16/21. Per protocol, immobilization orthosis reduced to wrist only w/ OT fabricating new orthosis out of 1/8" perforated thermoplastic considering appropriatness of increased material breathability related to pt's activity level. Pt able to don/doff orthosis independently and OT provided additional education regarding wear (e.g., continuing w/ wear at night/while sleeping, during active/dynamic activities, and when outdoors). OT also reviewed wrist flexion/extension AAROM exercise w/ pt and introduced AAROM of wrist radial/ulnar deviation w/ unweighted ball. Tenderness to palpation of dorsal wrist reported w/ OT providing relevant continued condition-specific education and  answering pt questions as able.    OT Occupational Profile and History Problem Focused Assessment - Including review of records relating to presenting problem    Occupational performance deficits (Please refer to evaluation for details): ADL's;IADL's;Rest and Sleep;Work;Leisure    Body Structure / Function / Physical  Skills Decreased knowledge of precautions;ROM;UE functional use;Scar mobility;Body mechanics;Dexterity;Edema;GMC;Endurance;Pain;Strength    Rehab Potential Excellent    Clinical Decision Making Several treatment options, min-mod task modification necessary    Comorbidities Affecting Occupational Performance: None    Modification or Assistance to Complete Evaluation  Min-Moderate modification of tasks or assist with assess necessary to complete eval    OT Frequency 2x / week    OT Duration 6 weeks    OT Treatment/Interventions Self-care/ADL training;Electrical Stimulation;Iontophoresis;Therapeutic exercise;Moist Heat;Paraffin;Compression bandaging;Splinting;Patient/family education;Fluidtherapy;Scar mobilization;Therapeutic activities;Cryotherapy;Ultrasound;DME and/or AE instruction;Manual Therapy;Passive range of motion    Plan Review mid-range AAROM and initiate PROM for wrist flex/ext (provide handout); dart throwing motion exercise    OT Home Exercise Plan MedBridge Code: [email or web address] - AROM of wrist flex/ext and radial/ulnar deviation; composite AROM of thumb flex/ext; wrist flex/ext and radial/ulnar devation AAROM w/ ball    Consulted and Agree with Plan of Care Patient            Patient will benefit from skilled therapeutic intervention in order to improve the following deficits and impairments:   Body Structure / Function / Physical Skills: Decreased knowledge of precautions, ROM, UE functional use, Scar mobility, Body mechanics, Dexterity, Edema, GMC, Endurance, Pain, Strength   Visit Diagnosis: Pain in left wrist  Stiffness of left wrist, not elsewhere  classified  Muscle weakness (generalized)  Localized edema  Other symptoms and signs involving the musculoskeletal system   Problem List Patient Active Problem List   Diagnosis Date Noted   Complete tear of scapholunate ligament    Left scapholunate ligament tear 03/05/2021   Chronic pain of left wrist 02/26/2021    Rosie Fate, MSOT, OTR/L 05/18/2021, :39 PM  [facility] [address] [place], [place], [address] Phone: [phone]   Fax:  [phone]  Name: Elick Aguilera MRN: [id number] Date of Birth: [birth year]

## 2021-05-18 NOTE — Therapy (Signed)
[facility] [address] [place], [place], [address] Phone: [phone]   Fax:  [phone]  Occupational Therapy Treatment  Patient Details  Name: Ronald Swanson MRN: [id number] Date of Birth: 06/26/1990 Referring Provider (OT): Park Liter, MD (Orthopedic Surgery)   Encounter Date: 05/17/2021   OT End of Session - 05/17/21 1624     Visit Number 3    Number of Visits 13    Date for OT Re-Evaluation 07/31/21    Authorization Type UMR    Authorization Time Period VL: 25 then authorization required    Authorization - Visit Number 1    Authorization - Number of Visits 25    OT Start Time 1616    OT Stop Time 1701    OT Time Calculation (min) 45 min    Activity Tolerance Patient tolerated treatment well    Behavior During Therapy [facility] for tasks assessed/performed            History reviewed. No pertinent past medical history.  Past Surgical History:  Procedure Laterality Date   LIGAMENT REPAIR Left 03/16/2021   Procedure: LEFT SCAPHOLUNATE LIGAMENT RECONSTRUCTION;  Surgeon: Marlyne Beards, MD;  Location: [facility];  Service: Orthopedics;  Laterality: Left;    There were no vitals filed for this visit.   Subjective Assessment - 05/17/21 1618     Subjective  "I don't like this exercise" (indicating AROM of radial and ulnar deviation)    Pertinent History Left scapholunate ligament reconstruction 03/16/21 s/p complete SLL tear    Patient Stated Goals Get back to the gym/weight lifting    Currently in Pain? Yes    Pain Score 4     Pain Location Wrist    Pain Orientation Left    Pain Descriptors / Indicators Tightness    Pain Type Acute pain    Pain Onset More than a month ago    Pain Frequency --             [facility] OT Assessment - 05/17/21 1629       AROM   AROM Assessment Site Wrist;Thumb    Right Wrist Extension 67 Degrees    Right Wrist Flexion 65 Degrees    Right Wrist Radial  Deviation 24 Degrees    Right Wrist Ulnar Deviation 43 Degrees    Left Wrist Extension 39 Degrees    Left Wrist Flexion 11 Degrees    Left Wrist Radial Deviation 10 Degrees    Left Wrist Ulnar Deviation 17 Degrees      Right Hand AROM   R Thumb MCP 0-60 83 Degrees    R Thumb IP 0-80 66 Degrees      Left Hand AROM   L Thumb MCP 0-60 55 Degrees    L Thumb IP 0-80 62 Degrees             OT Education - 05/17/21 1644     Education Details Continued condition-specific education and progressed HEP per protocol (see 'OT HEP' below)    Person(s) Educated Patient    Methods Explanation;Demonstration    Comprehension Verbalized understanding;Returned demonstration             Treatment/Exercises - 05/17/21    Hessie Knows of wrist flexion/extension; completed 1st set w/ assist from unaffected RUE and 2nd set of small unweighted ball, gently rolling forward and back; OT provided education regarding AAROM vs PROM/stretching as well as modeling and verbal cues to facilitate understanding and comfort w/  exercises    Orthosis Management Decreased length of forearm extension of custom-fabricated wrist and thumb static orthosis for pt comfort; orthosis to be reduced to wrist only w/ perforated material next session            OT Short Term Goals - 05/10/21 1707       OT SHORT TERM GOAL #1   Title Pt will verbalize understanding of wear and care of custom-fit orthosis    Time 1    Period Days    Status Achieved   05/02/21 - document signed and administered   Target Date 05/02/21      OT SHORT TERM GOAL #2   Title Pt will be able to achieve mid-range AROM of wrist flexion/extension w/ discomfort less than 4/10    Baseline TBA    Time 2    Period Weeks    Status On-going    Target Date 05/18/21             OT Long Term Goals - 05/17/21 1745       OT LONG TERM GOAL #1   Title Pt will demonstrate independence w/ HEP designed for strength and ROM by d/c    Baseline No  HEP at this time    Time 6    Period Weeks    Status On-going    Target Date 06/15/21      OT LONG TERM GOAL #2   Title Pt will achieve at least 75% of wrist flexion/extension ROM compared to R wrist to facilitate eventual return to weight-lifting activities    Baseline R wrist flexion/extension 65 and 67* respectively; L wrist flex/ext goal ~50*    Time 6    Period Weeks    Status On-going    Target Date 06/15/21      OT LONG TERM GOAL #3   Title Pt will demonstrate L hand grip strength of at least 20 lbs by d/c    Baseline Unable to assess due to precautions    Time 6    Period Weeks    Status On-going    Target Date 06/15/21      OT LONG TERM GOAL #4   Title Pt will demonstrate ROM of L thumb WFL compared to R thumb    Baseline L thumb MCP flexion 55* (R thumb 83*)    Time 6    Period Weeks    Status On-going    Target Date 06/15/21      OT LONG TERM GOAL #5   Title Pt will improve functional use of LUE as evidenced by increasing QuickDASH score by at least 10 pts by d/c    Baseline TBA    Time 6    Period Weeks    Status On-going    Target Date 06/15/21             Plan - 05/17/21 1645     Clinical Impression Statement Pt returns to OT 9 weeks s/p SLL reconstruction on 03/16/21. AROM measurements taken this session w/ pt demonstrating most notable limitation of L wrist flexion (achieving 20% of ROM at this time); approximately 50% ROM in other planes of wrist movement. Thumb MCP flexion is slightly limited compared to R side w/ thumb IP flexion, radial abd/adduction, and palmar abd/add WNL. Per protocol, OT introduced AAROM into HEP this session, starting w/ wrist flexion/extension only due to pt's current discomfort w/ wrist radial/ulnar deviation. OT also discussed wearing schedule for orthosis and discussed reducing  orthosis to wrist-only at this stage of recovery; to be completed next session.    OT Occupational Profile and History Problem Focused Assessment -  Including review of records relating to presenting problem    Occupational performance deficits (Please refer to evaluation for details): ADL's;IADL's;Rest and Sleep;Work;Leisure    Body Structure / Function / Physical Skills Decreased knowledge of precautions;ROM;UE functional use;Scar mobility;Body mechanics;Dexterity;Edema;GMC;Endurance;Pain;Strength    Rehab Potential Excellent    Clinical Decision Making Several treatment options, min-mod task modification necessary    Comorbidities Affecting Occupational Performance: None    Modification or Assistance to Complete Evaluation  Min-Moderate modification of tasks or assist with assess necessary to complete eval    OT Frequency 2x / week    OT Duration 6 weeks    OT Treatment/Interventions Self-care/ADL training;Electrical Stimulation;Iontophoresis;Therapeutic exercise;Moist Heat;Paraffin;Compression bandaging;Splinting;Patient/family education;Fluidtherapy;Scar mobilization;Therapeutic activities;Cryotherapy;Ultrasound;DME and/or AE instruction;Manual Therapy;Passive range of motion    Plan Circumferential wrist-only orthosis; review mid-range AAROM and initiate PROM for wrist flex/ext; dart throwing motion exercise    OT Home Exercise Plan MedBridge Code: [email or web address] - AROM of wrist flex/ext and radial/ulnar deviation; composite AROM of thumb flex/ext    Consulted and Agree with Plan of Care Patient            Patient will benefit from skilled therapeutic intervention in order to improve the following deficits and impairments:   Body Structure / Function / Physical Skills: Decreased knowledge of precautions, ROM, UE functional use, Scar mobility, Body mechanics, Dexterity, Edema, GMC, Endurance, Pain, Strength   Visit Diagnosis: Pain in left wrist  Stiffness of left wrist, not elsewhere classified  Muscle weakness (generalized)  Localized edema  Other symptoms and signs involving the musculoskeletal system   Problem List Patient  Active Problem List   Diagnosis Date Noted   Complete tear of scapholunate ligament    Left scapholunate ligament tear 03/05/2021   Chronic pain of left wrist 02/26/2021    Rosie Fate, MSOT, OTR/L 05/18/2021, 8:46 AM  [facility] [place] [address] [place], [place], [address] Phone: [phone]   Fax:  [phone]  Name: Ronald Swanson MRN: [id number] Date of Birth: 1990-09-21

## 2021-05-21 ENCOUNTER — Other Ambulatory Visit: Payer: Self-pay

## 2021-05-21 ENCOUNTER — Encounter: Payer: Self-pay | Admitting: Occupational Therapy

## 2021-05-21 ENCOUNTER — Ambulatory Visit: Payer: 59 | Admitting: Occupational Therapy

## 2021-05-21 DIAGNOSIS — R29898 Other symptoms and signs involving the musculoskeletal system: Secondary | ICD-10-CM | POA: Diagnosis not present

## 2021-05-21 DIAGNOSIS — M25632 Stiffness of left wrist, not elsewhere classified: Secondary | ICD-10-CM | POA: Diagnosis not present

## 2021-05-21 DIAGNOSIS — M6281 Muscle weakness (generalized): Secondary | ICD-10-CM

## 2021-05-21 DIAGNOSIS — M25532 Pain in left wrist: Secondary | ICD-10-CM

## 2021-05-21 DIAGNOSIS — R600 Localized edema: Secondary | ICD-10-CM | POA: Diagnosis not present

## 2021-05-22 NOTE — Therapy (Signed)
[facility] [address] [place], [place], [address] Phone: [phone]   Fax:  [phone]  Occupational Therapy Treatment  Patient Details  Name: Ronald Swanson MRN: [id number] Date of Birth: 22-Oct-1990 Referring Provider (OT): Park Liter, MD (Orthopedic Surgery)   Encounter Date: 05/21/2021   OT End of Session - 05/21/21 1709     Visit Number 5    Number of Visits 13    Date for OT Re-Evaluation 07/31/21    Authorization Type UMR    Authorization Time Period VL: 25 then authorization required    Authorization - Visit Number 3    Authorization - Number of Visits 25    OT Start Time 1703    OT Stop Time 1800    OT Time Calculation (min) 57 min    Activity Tolerance Patient tolerated treatment well    Behavior During Therapy [facility] for tasks assessed/performed            History reviewed. No pertinent past medical history.  Past Surgical History:  Procedure Laterality Date   LIGAMENT REPAIR Left 03/16/2021   Procedure: LEFT SCAPHOLUNATE LIGAMENT RECONSTRUCTION;  Surgeon: Marlyne Beards, MD;  [facility];  Service: Orthopedics;  Laterality: Left;    There were no vitals filed for this visit.   Subjective Assessment - 05/21/21 1707     Subjective  Pt reports feeling like his orthosis is rubbing against the inside of this thumb    Pertinent History Left scapholunate ligament reconstruction 03/16/21 s/p complete SLL tear    Patient Stated Goals Get back to the gym/weight lifting    Currently in Pain? Yes    Pain Score 4     Pain Location Wrist    Pain Orientation Left    Pain Descriptors / Indicators Tightness    Pain Onset More than a month ago             Treatment/Exercises - 05/21/21    PROM OT-facilitated PROM of wrist radial/ulnar deviation and flexion/extension; completed 15x each. OT then instructed pt through self-PROM, providing verbal cues, modeling, and tactile cues for  gradation of force and pacing    Dart Throwing Exercise OT demonstrated dart-throwing motion exercise to include in HEP; during first attempt, pt moved through motion very quickly and reported discomfort in the wrist after that continued to improve w/ rest. OT provided education on slower, more controlled movements at this stage w/ pt verbalizing understanding; exercise not completed or included in HEP at this time    AROM Full-arc AROM of wrist radial/ulnar deviation completed w/ elbow flexed and resting on tabletop (forearm in neutral) in gravity-minimized position due to comfort vs completing exercise w/ forearm pronated resting on tabletop    Orthosis Management Adjustment of wrist immobilization orthosis - cut back rolled material along superior border of thumbhole for increased comfort/decreased thumb irritation; remolded to ensure appropriate fit            OT Education - 05/21/21 1734     Education Details Continued condition-specific education, answering pt questions prn, particularly regarding progression of protocol, current movement/lifting restrictions/precautions, and anatomy of impacted structures   Person(s) Educated Patient    Methods Explanation   Comprehension Verbalized understanding            OT Short Term Goals - 05/10/21 1707       OT SHORT TERM GOAL #1   Title Pt will verbalize understanding of wear and care of custom-fit  orthosis    Time 1    Period Days    Status Achieved   05/02/21 - document signed and administered   Target Date 05/02/21      OT SHORT TERM GOAL #2   Title Pt will be able to achieve mid-range AROM of wrist flexion/extension w/ discomfort less than 4/10    Baseline TBA    Time 2    Period Weeks    Status On-going    Target Date 05/18/21             OT Long Term Goals - 05/17/21 1745       OT LONG TERM GOAL #1   Title Pt will demonstrate independence w/ HEP designed for strength and ROM by d/c    Baseline No HEP at this  time    Time 6    Period Weeks    Status On-going    Target Date 06/15/21      OT LONG TERM GOAL #2   Title Pt will achieve at least 75% of wrist flexion/extension ROM compared to R wrist to facilitate eventual return to weight-lifting activities    Baseline R wrist flexion/extension 65 and 67* respectively; L wrist flex/ext goal ~50*    Time 6    Period Weeks    Status On-going    Target Date 06/15/21      OT LONG TERM GOAL #3   Title Pt will demonstrate L hand grip strength of at least 20 lbs by d/c    Baseline Unable to assess due to precautions    Time 6    Period Weeks    Status On-going    Target Date 06/15/21      OT LONG TERM GOAL #4   Title Pt will demonstrate ROM of L thumb WFL compared to R thumb    Baseline L thumb MCP flexion 55* (R thumb 83*)    Time 6    Period Weeks    Status On-going    Target Date 06/15/21      OT LONG TERM GOAL #5   Title Pt will improve functional use of LUE as evidenced by increasing QuickDASH score by at least 10 pts by d/c    Baseline TBA    Time 6    Period Weeks    Status On-going    Target Date 06/15/21             Plan - 05/21/21 1735     Clinical Impression Statement Pt is currently 9.5 weeks s/p SLL reconstruction w/ Dr. Frazier Butt[place]field on 03/16/21. Advancing exercises per protocol, OT introduced PROM this session, providing corresponding education on the importance of never forcing movement. Due to continued discomfort reported w/ radial/ulnar deviation movement, OT did not include PROM of rad/ulnar dev, instead problem-solving w/ pt to identify different positioning when completed AROM for improved comfort. Pt continues to benefit from condition-specific education, discussion of protocol progression, and review of orthosis wear at this stage of recovery.   OT Occupational Profile and History Problem Focused Assessment - Including review of records relating to presenting problem    Occupational performance deficits (Please refer to  evaluation for details): ADL's;IADL's;Rest and Sleep;Work;Leisure    Body Structure / Function / Physical Skills Decreased knowledge of precautions;ROM;UE functional use;Scar mobility;Body mechanics;Dexterity;Edema;GMC;Endurance;Pain;Strength    Rehab Potential Excellent    Clinical Decision Making Several treatment options, min-mod task modification necessary    Comorbidities Affecting Occupational Performance: None    Modification or Assistance  to Complete Evaluation  Min-Moderate modification of tasks or assist with assess necessary to complete eval    OT Frequency 2x / week    OT Duration 6 weeks    OT Treatment/Interventions Self-care/ADL training;Electrical Stimulation;Iontophoresis;Therapeutic exercise;Moist Heat;Paraffin;Compression bandaging;Splinting;Patient/family education;Fluidtherapy;Scar mobilization;Therapeutic activities;Cryotherapy;Ultrasound;DME and/or AE instruction;Manual Therapy;Passive range of motion    Plan Reassess AROM measurements; continue w/ protocol (full AROM, AAROM, PROM) and introduce putty if able   OT Home Exercise Plan MedBridge Code: [email or web address] - AROM of wrist flex/ext and radial/ulnar deviation; composite AROM of thumb flex/ext; wrist flex/ext and radial/ulnar devation AAROM w/ ball    Consulted and Agree with Plan of Care Patient            Patient will benefit from skilled therapeutic intervention in order to improve the following deficits and impairments:   Body Structure / Function / Physical Skills: Decreased knowledge of precautions, ROM, UE functional use, Scar mobility, Body mechanics, Dexterity, Edema, GMC, Endurance, Pain, Strength   Visit Diagnosis: Pain in left wrist  Stiffness of left wrist, not elsewhere classified  Muscle weakness (generalized)  Other symptoms and signs involving the musculoskeletal system   Problem List Patient Active Problem List   Diagnosis Date Noted   Complete tear of scapholunate ligament    Left  scapholunate ligament tear 03/05/2021   Chronic pain of left wrist 02/26/2021    Rosie Fate, MSOT, OTR/L  05/21/2021, 6:35 PM  [facility] [place] [place] [facility] [address] [place], [place], [address] Phone: [phone]   Fax:  [phone]  Name: Ronald Swanson MRN: [id number] Date of Birth: 10/03/1990

## 2021-05-23 ENCOUNTER — Ambulatory Visit: Payer: 59 | Admitting: Occupational Therapy

## 2021-05-23 ENCOUNTER — Other Ambulatory Visit: Payer: Self-pay

## 2021-05-23 DIAGNOSIS — R29898 Other symptoms and signs involving the musculoskeletal system: Secondary | ICD-10-CM

## 2021-05-23 DIAGNOSIS — M6281 Muscle weakness (generalized): Secondary | ICD-10-CM | POA: Diagnosis not present

## 2021-05-23 DIAGNOSIS — M25532 Pain in left wrist: Secondary | ICD-10-CM | POA: Diagnosis not present

## 2021-05-23 DIAGNOSIS — R600 Localized edema: Secondary | ICD-10-CM

## 2021-05-23 DIAGNOSIS — M25632 Stiffness of left wrist, not elsewhere classified: Secondary | ICD-10-CM

## 2021-05-24 NOTE — Therapy (Signed)
[place]. [place], [place], [address] Phone: [phone]   Fax:  [phone]  Occupational Therapy Treatment  Patient Details  Name: Ronald Swanson MRN: [id number] Date of Birth: [birth year] Referring Provider (OT): Lynda Rainwater, MD (Orthopedic Surgery)   Encounter Date: 05/23/2021   OT End of Session - 05/23/21 1706     Visit Number 6    Number of Visits 13    Date for OT Re-Evaluation 07/31/21    Authorization Type UMR    Authorization Time Period VL: 25 then authorization required    Authorization - Visit Number 4    Authorization - Number of Visits 25    OT Start Time 1700    OT Stop Time 9371   OT Time Calculation (min) 58 min    Activity Tolerance Patient tolerated treatment well    Behavior During Therapy [facility] for tasks assessed/performed            No past medical history on file.  Past Surgical History:  Procedure Laterality Date   LIGAMENT REPAIR Left 03/16/2021   Procedure: LEFT SCAPHOLUNATE LIGAMENT RECONSTRUCTION;  Surgeon: Sherilyn Cooter, MD;  Location: [facility];  Service: Orthopedics;  Laterality: Left;    There were no vitals filed for this visit.   Subjective Assessment - 05/23/21 1702     Subjective  Pt reports a "popping" sensation w/ radial and ulnar deviation, but that it is not painful    Pertinent History Left scapholunate ligament reconstruction 03/16/21 s/p complete SLL tear    Patient Stated Goals Get back to the gym/weight lifting    Currently in Pain? No/denies   Reports it was a 3/10 earlier; describes it as stiffness            OT Education - 05/23/21 1747    Education Details Continued condition-specific education, particularly regarding scar healing w/ OT answering pt's questions as able, edema management, and potential impact of condition on ROM.    Person(s) Educated Patient    Methods Explanation   Comprehension Verbalized understanding             Treatment/Exercises - 05/23/21    Wrist Exercises AROM of wrist flexion/extension completed w/ long end-range active stretches for each movement. Decreased arc of motion observed w/ wrist flexion compared to extension. Pt able to complete within tolerable level of discomfort and w/out pain  Reviewed wrist flex/ext AAROM w/ unweighted ball to reassess positioning and alignment during exercise; able to complete w/out pain    Putty Exercises Full gross grasp of yellow putty w/ L hand, focusing on attempting movement pattern against resistance opposed to straining to force movement; completed 25x. OT provided verbal cues for alignment and force gradation due to pt-reported discomfort, which resolved w/ adjustment of positioning    Modalities: Cold Therapy Cold pack applied circumferentially to L wrist for 10 min for pain and edema management at conclusion of session after completing exercises           [facility] OT Assessment - 05/17/21 1629       AROM    Left Wrist Extension 55 Degrees     Left Wrist Flexion 20 Degrees     Left Wrist Radial Deviation 15 Degrees     Left Wrist Ulnar Deviation 21 Degrees      OT Short Term Goals - 05/23/21 1738       OT SHORT TERM GOAL #1   Title Pt will verbalize understanding  of wear and care of custom-fit orthosis    Time 1    Period Days    Status Achieved   05/02/21 - document signed and administered   Target Date 05/02/21      OT SHORT TERM GOAL #2   Title Pt will be able to achieve mid-range AROM of wrist flexion/extension w/ discomfort less than 4/10    Baseline Approx 30 degrees of flex/ext    Time 2    Period Weeks    Status Partially Met   05/23/20 - met for wrist extension, but not flexion   Target Date 05/18/21             OT Long Term Goals - 05/17/21 1745       OT LONG TERM GOAL #1   Title Pt will demonstrate independence w/ HEP designed for strength and ROM by d/c    Baseline No HEP at this time    Time 6     Period Weeks    Status On-going    Target Date 06/15/21      OT LONG TERM GOAL #2   Title Pt will achieve at least 75% of wrist flexion/extension ROM compared to R wrist to facilitate eventual return to weight-lifting activities    Baseline R wrist flexion/extension 65 and 67* respectively; L wrist flex/ext goal ~50*    Time 6    Period Weeks    Status On-going    Target Date 06/15/21      OT LONG TERM GOAL #3   Title Pt will demonstrate L hand grip strength of at least 20 lbs by d/c    Baseline Unable to assess due to precautions    Time 6    Period Weeks    Status On-going    Target Date 06/15/21      OT LONG TERM GOAL #4   Title Pt will demonstrate ROM of L thumb WFL compared to R thumb    Baseline L thumb MCP flexion 55* (R thumb 83*)    Time 6    Period Weeks    Status On-going    Target Date 06/15/21      OT LONG TERM GOAL #5   Title Pt will improve functional use of LUE as evidenced by increasing QuickDASH score by at least 10 pts by d/c    Baseline TBA    Time 6    Period Weeks    Status On-going    Target Date 06/15/21             Plan - 05/23/21 1707     Clinical Impression Statement Pt is currently almost 10 weeks s/p SLL reconstruction w/ Dr. Tempie Donning on 03/16/21. Session started w/ informal assessment of wrist radial/ulnar deviation due to pt's report of "popping" sensation; OT palpating crepitus w/ movement but pt denies pain. OT provided corresponding education on potential etiology and recommended pt d/c rad/ulnar dev exercises for a few days, implementing edema management strategies and continuing w/ other exercises included in HEP, before re-attempting rad/ulnar dev. Continuing w/ protocol, OT introduced light hand strengthening w/ putty and included exercise in HEP. Reassessment completed for wrist AROM w/ improvements demonstrated in all planes of movement; wrist flexion remains most limited. To facilitate increased stretch w/out potential for pt  forcing movement, OT introduced end-range active stretches w/ positive results. Mild edema observed, particularly along ulnar border of wrist (L wrist 1.4 cm > R wrist), which will continue to be monitored.  OT Occupational Profile and History Problem Focused Assessment - Including review of records relating to presenting problem    Occupational performance deficits (Please refer to evaluation for details): ADL's;IADL's;Rest and Sleep;Work;Leisure    Body Structure / Function / Physical Skills Decreased knowledge of precautions;ROM;UE functional use;Scar mobility;Body mechanics;Dexterity;Edema;GMC;Endurance;Pain;Strength    Rehab Potential Excellent    Clinical Decision Making Several treatment options, min-mod task modification necessary    Comorbidities Affecting Occupational Performance: None    Modification or Assistance to Complete Evaluation  Min-Moderate modification of tasks or assist with assess necessary to complete eval    OT Frequency 2x / week    OT Duration 6 weeks    OT Treatment/Interventions Self-care/ADL training;Electrical Stimulation;Iontophoresis;Therapeutic exercise;Moist Heat;Paraffin;Compression bandaging;Splinting;Patient/family education;Fluidtherapy;Scar mobilization;Therapeutic activities;Cryotherapy;Ultrasound;DME and/or AE instruction;Manual Therapy;Passive range of motion    Plan Reassess attempting dart-throwing exercise; gentle passive stretching of wrist and thumb MPJ   OT Home Exercise Plan MedBridge Code: [email or web address] - AROM of wrist flex/ext and radial/ulnar deviation; composite AROM of thumb flex/ext; wrist flex/ext and radial/ulnar devation AAROM w/ ball; wrist flex/ext PROM; putty squeezes   Consulted and Agree with Plan of Care Patient            Patient will benefit from skilled therapeutic intervention in order to improve the following deficits and impairments:   Body Structure / Function / Physical Skills: Decreased knowledge of precautions, ROM, UE  functional use, Scar mobility, Body mechanics, Dexterity, Edema, GMC, Endurance, Pain, Strength   Visit Diagnosis: Stiffness of left wrist, not elsewhere classified  Muscle weakness (generalized)  Other symptoms and signs involving the musculoskeletal system  Localized edema   Problem List Patient Active Problem List   Diagnosis Date Noted   Complete tear of scapholunate ligament    Left scapholunate ligament tear 03/05/2021   Chronic pain of left wrist 02/26/2021    Kathrine Cords, MSOT, OTR/L 05/23/2021, 6:09 PM  [place]. [place], [place], [address] Phone: [phone]   Fax:  [phone]  Name: Ronald Swanson MRN: [id number] Date of Birth: [birth year]

## 2021-05-28 ENCOUNTER — Ambulatory Visit: Payer: 59 | Admitting: Occupational Therapy

## 2021-05-28 ENCOUNTER — Other Ambulatory Visit: Payer: Self-pay

## 2021-05-28 DIAGNOSIS — R29898 Other symptoms and signs involving the musculoskeletal system: Secondary | ICD-10-CM

## 2021-05-28 DIAGNOSIS — M6281 Muscle weakness (generalized): Secondary | ICD-10-CM

## 2021-05-28 DIAGNOSIS — M25532 Pain in left wrist: Secondary | ICD-10-CM | POA: Diagnosis not present

## 2021-05-28 DIAGNOSIS — M25632 Stiffness of left wrist, not elsewhere classified: Secondary | ICD-10-CM | POA: Diagnosis not present

## 2021-05-28 DIAGNOSIS — R600 Localized edema: Secondary | ICD-10-CM | POA: Diagnosis not present

## 2021-05-29 ENCOUNTER — Encounter: Payer: Self-pay | Admitting: Occupational Therapy

## 2021-05-29 NOTE — Therapy (Signed)
[place]. [place], [place], [address] Phone: [phone]   Fax:  [phone]  Occupational Therapy Treatment  Patient Details  Name: Ronald Swanson MRN: [id number] Date of Birth: [birth year] Referring Provider (OT): Lynda Rainwater, MD (Orthopedic Surgery)   Encounter Date: 05/28/2021   OT End of Session - 05/28/21 1721     Visit Number 7    Number of Visits 13    Date for OT Re-Evaluation 07/31/21    Authorization Type UMR    Authorization Time Period VL: 25 then authorization required    Authorization - Visit Number 5    Authorization - Number of Visits 25    OT Start Time 1700    OT Stop Time 7026   OT Time Calculation (min) 58 min    Activity Tolerance Patient tolerated treatment well    Behavior During Therapy [facility] for tasks assessed/performed            No past medical history on file.  Past Surgical History:  Procedure Laterality Date   LIGAMENT REPAIR Left 03/16/2021   Procedure: LEFT SCAPHOLUNATE LIGAMENT RECONSTRUCTION;  Surgeon: Sherilyn Cooter, MD;  Location: [facility];  Service: Orthopedics;  Laterality: Left;    There were no vitals filed for this visit.   Subjective Assessment - 05/28/21 1702     Subjective  "I'm ready to get this stiffness worked out"    Pertinent History Left scapholunate ligament reconstruction 03/16/21 s/p complete SLL tear    Patient Stated Goals Get back to the gym/weight lifting    Currently in Pain? No/denies             OT Education - 05/28/21 1711     Education Details Continued condition-specific education, including but not limited to current restrictions and progression of protocol, answering pt's questions as able   Person(s) Educated Patient    Methods Explanation    Comprehension Verbalized understanding             Treatment/Exercises - 05/28/21    Orthosis Management OT reviewed potential benefit/need and purpose of  custom-fabricated dynamic flexion/extension orthosis for the wrist and answered pt questions prn. Discussed w/ pt that he would go to another clinic if this is indicated; surgeon orders required     Modalities: Hot Pack Wrapped hot pack applied circumferentially to L wrist for 10 min prior to completing exercises; no adverse reaction    PROM OT performed gentle passive stretching/PROM of wrist flex/ext, holding stretch at end-range, to increase ROM and decrease joint stiffness; able to achieve good wrist extension ROM and fair wrist flexion ROM. Pt tolerated exercise w/out discomfort; mild crepitus palpated w/ initial reps that resolved w/ repetition  Gentle PROM of wrist radial/ulnar deviation w/ no reported discomfort; pt continues to report "popping" w/ AROM of wrist radial/ulnar deviation  OT performed gentle passive stretching of thumb MPJ flexion, holding stretch at end-range, to increased ROM and decrease joint stiffness. Able to achieve arc of motion WFL. Pt reported "stretching," but no pain/discomfort w/ exercise.            OT Short Term Goals - 05/23/21 1738       OT SHORT TERM GOAL #1   Title Pt will verbalize understanding of wear and care of custom-fit orthosis    Time 1    Period Days    Status Achieved   05/02/21 - document signed and administered   Target Date 05/02/21  OT SHORT TERM GOAL #2   Title Pt will be able to achieve mid-range AROM of wrist flexion/extension w/ discomfort less than 4/10    Baseline Approx 30 degrees of flex/ext    Time 2    Period Weeks    Status Partially Met   05/23/20 - met for wrist extension, but not flexion   Target Date 05/18/21             OT Long Term Goals - 05/17/21 1745       OT LONG TERM GOAL #1   Title Pt will demonstrate independence w/ HEP designed for strength and ROM by d/c    Baseline No HEP at this time    Time 6    Period Weeks    Status On-going    Target Date 06/15/21      OT LONG TERM GOAL #2    Title Pt will achieve at least 75% of wrist flexion/extension ROM compared to R wrist to facilitate eventual return to weight-lifting activities    Baseline R wrist flexion/extension 65 and 67* respectively; L wrist flex/ext goal ~50*    Time 6    Period Weeks    Status On-going    Target Date 06/15/21      OT LONG TERM GOAL #3   Title Pt will demonstrate L hand grip strength of at least 20 lbs by d/c    Baseline Unable to assess due to precautions    Time 6    Period Weeks    Status On-going    Target Date 06/15/21      OT LONG TERM GOAL #4   Title Pt will demonstrate ROM of L thumb WFL compared to R thumb    Baseline L thumb MCP flexion 55* (R thumb 83*)    Time 6    Period Weeks    Status On-going    Target Date 06/15/21      OT LONG TERM GOAL #5   Title Pt will improve functional use of LUE as evidenced by increasing QuickDASH score by at least 10 pts by d/c    Baseline TBA    Time 6    Period Weeks    Status On-going    Target Date 06/15/21             Plan - 05/28/21 1724     Clinical Impression Statement Pt is currently 10.5 weeks s/p SLL reconstruction w/ Dr. Tempie Donning on 03/16/21. Most limited ROM continues to be w/ wrist flexion and pt continues to report "popping" that occurs w/ wrist radial/ulnar deviation; present only w/ AROM and denies associated pain w/ this sensation. Per protocol, OT continued to progress w/ PROM, incorporating gentle passive stretching, focusing on wrist flexion. Arc of motion significantly limited, but pt denied pain w/ exercise. OT reviewed exercise to be included in HEP and again reiterated necessity of avoiding forceful motion considering wrist procedure; pt verbalized understanding. Edema also reassessed this session w/ pt exhibiting improvement of 0.7 cm from prior measurement last week.   OT Occupational Profile and History Problem Focused Assessment - Including review of records relating to presenting problem    Occupational  performance deficits (Please refer to evaluation for details): ADL's;IADL's;Rest and Sleep;Work;Leisure    Body Structure / Function / Physical Skills Decreased knowledge of precautions;ROM;UE functional use;Scar mobility;Body mechanics;Dexterity;Edema;GMC;Endurance;Pain;Strength    Rehab Potential Excellent    Clinical Decision Making Several treatment options, min-mod task modification necessary    Comorbidities Affecting Occupational  Performance: None    Modification or Assistance to Complete Evaluation  Min-Moderate modification of tasks or assist with assess necessary to complete eval    OT Frequency 2x / week    OT Duration 6 weeks    OT Treatment/Interventions Self-care/ADL training;Electrical Stimulation;Iontophoresis;Therapeutic exercise;Moist Heat;Paraffin;Compression bandaging;Splinting;Patient/family education;Fluidtherapy;Scar mobilization;Therapeutic activities;Cryotherapy;Ultrasound;DME and/or AE instruction;Manual Therapy;Passive range of motion    Plan Dart-throwing exercise; continue gentle passive stretching of wrist and thumb MPJ    OT Home Exercise Plan MedBridge Code: [email or web address] - AROM of wrist flex/ext and radial/ulnar deviation; composite AROM of thumb flex/ext; wrist flex/ext and radial/ulnar devation AAROM w/ ball; wrist flex/ext PROM; putty squeezes    Consulted and Agree with Plan of Care Patient            Patient will benefit from skilled therapeutic intervention in order to improve the following deficits and impairments:   Body Structure / Function / Physical Skills: Decreased knowledge of precautions, ROM, UE functional use, Scar mobility, Body mechanics, Dexterity, Edema, GMC, Endurance, Pain, Strength   Visit Diagnosis: Stiffness of left wrist, not elsewhere classified  Muscle weakness (generalized)  Other symptoms and signs involving the musculoskeletal system  Localized edema  Pain in left wrist   Problem List Patient Active Problem List    Diagnosis Date Noted   Complete tear of scapholunate ligament    Left scapholunate ligament tear 03/05/2021   Chronic pain of left wrist 02/26/2021    Kathrine Cords, MSOT, OTR/L 05/28/2021, 5:13 PM  [address] [place]. [place], [place], [address] Phone: [phone]   Fax:  [phone]  Name: Yoon Barca MRN: [id number] Date of Birth: 08-19-1990

## 2021-05-30 ENCOUNTER — Telehealth: Payer: Self-pay | Admitting: Occupational Therapy

## 2021-05-30 ENCOUNTER — Telehealth: Payer: Self-pay

## 2021-05-30 ENCOUNTER — Encounter: Payer: Self-pay | Admitting: Occupational Therapy

## 2021-05-30 ENCOUNTER — Ambulatory Visit: Payer: 59 | Admitting: Occupational Therapy

## 2021-05-30 ENCOUNTER — Other Ambulatory Visit: Payer: Self-pay

## 2021-05-30 DIAGNOSIS — R600 Localized edema: Secondary | ICD-10-CM | POA: Diagnosis not present

## 2021-05-30 DIAGNOSIS — M25632 Stiffness of left wrist, not elsewhere classified: Secondary | ICD-10-CM | POA: Diagnosis not present

## 2021-05-30 DIAGNOSIS — M25532 Pain in left wrist: Secondary | ICD-10-CM | POA: Diagnosis not present

## 2021-05-30 DIAGNOSIS — M6281 Muscle weakness (generalized): Secondary | ICD-10-CM | POA: Diagnosis not present

## 2021-05-30 DIAGNOSIS — R29898 Other symptoms and signs involving the musculoskeletal system: Secondary | ICD-10-CM

## 2021-05-30 NOTE — Telephone Encounter (Signed)
Patient called stating that he has a red rash around his incision on his left wrist.  Would like a call back to discuss.  Patient does have an appointment on 05/31/2021.  CB# [phone].  Please advise.  Thank you.

## 2021-05-30 NOTE — Telephone Encounter (Signed)
Called and spoke with Mr. Shor to follow up on MyChart message sent last night around 9:45 PM. Pt reports he was getting ready for bed last night and experienced sudden onset of irritation around surgical scar. Denies pain, but reports it is "red and puffy," feels tight, particularly w/ wrist flexion, and presence of itchiness and a "rash." Pt included a picture in his MyChart message.  OT advised pt to call/contact his surgeon's office to discuss this as soon as able; pt was agreeable.  Kathrine Cords, MSOT, OTR/L 05/30/2021, 9:42 AM  [facility] at [facility] [address] [place], [place] [address] Phone: [phone]

## 2021-05-30 NOTE — Patient Instructions (Signed)
° °  Bring your wrist back and toward you with your fingers bending (like you're holding a dart); Then push your wrist forward (**slowly and gently**) while straightening your fingers out.

## 2021-05-31 ENCOUNTER — Ambulatory Visit: Payer: Self-pay

## 2021-05-31 ENCOUNTER — Ambulatory Visit (INDEPENDENT_AMBULATORY_CARE_PROVIDER_SITE_OTHER): Payer: 59 | Admitting: Orthopedic Surgery

## 2021-05-31 ENCOUNTER — Encounter: Payer: Self-pay | Admitting: Orthopedic Surgery

## 2021-05-31 DIAGNOSIS — S63599A Other specified sprain of unspecified wrist, initial encounter: Secondary | ICD-10-CM

## 2021-05-31 DIAGNOSIS — M25532 Pain in left wrist: Secondary | ICD-10-CM

## 2021-05-31 NOTE — Therapy (Signed)
[facility]. [place], [place], [address] Phone: [phone]   Fax:  [phone]  Occupational Therapy Treatment  Patient Details  Name: Ronald Swanson MRN: [id number] Date of Birth: [birth year] Referring Provider (OT): Lynda Rainwater, MD (Orthopedic Surgery)   Encounter Date: 05/30/2021   OT End of Session - 05/30/21 5456     Visit Number 8    Number of Visits 13    Date for OT Re-Evaluation 07/31/21    Authorization Type UMR    Authorization Time Period VL: 25 then authorization required    Authorization - Visit Number 6    Authorization - Number of Visits 25    OT Start Time 1701    OT Stop Time 1731    OT Time Calculation (min) 30 min    Activity Tolerance Patient tolerated treatment well    Behavior During Therapy [facility] for tasks assessed/performed            History reviewed. No pertinent past medical history.  Past Surgical History:  Procedure Laterality Date   LIGAMENT REPAIR Left 03/16/2021   Procedure: LEFT SCAPHOLUNATE LIGAMENT RECONSTRUCTION;  Surgeon: Sherilyn Cooter, MD;  Location: [place];  Service: Orthopedics;  Laterality: Left;    There were no vitals filed for this visit.   Subjective Assessment - 05/30/21 1711     Subjective  Pt reports the swelling and redness have decreased since this morning, but rash is still present and continues to feel itchy. 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    Pertinent History Left scapholunate ligament reconstruction 03/16/21 s/p complete SLL tear    Patient Stated Goals Get back to the gym/weight lifting    Currently in Pain? No/denies             OT Education - 05/30/21 1802     Education Details Education provided on edema management (compression, elevation, ice);  avoiding heat in case of infection; and d/c uncomfortable/painful exercises until after follow-up w/ MD tomorrow. OT also dicussed benefit of consulting his pharmacist to identify very mild option for topical anti-itch oinment to help manage irritation.    Person(s) Educated Patient    Methods Explanation;Handout    Comprehension Verbalized understanding             Treatment/Exercises - 05/30/21    Dart Throwing Motion OT demonstrated and provided corresponding education for DTM attempted unsuccessfully in a previous session; OT emphasized radial wrist extension to ulnar wrist flexion pattern of movement w/ pt able to complete exercise within tolerable level of discomfort. OT provided verbal cues and modeling for pacing and alignment during exercise.            OT Short Term Goals - 05/23/21 1738       OT SHORT TERM GOAL #1   Title Pt will verbalize understanding of wear and care of custom-fit orthosis    Time 1    Period Days    Status Achieved   05/02/21 - document signed and administered   Target Date 05/02/21      OT SHORT TERM GOAL #2   Title Pt will be able to achieve mid-range AROM of wrist flexion/extension w/ discomfort less than 4/10    Baseline Approx 30 degrees of flex/ext    Time 2    Period  Weeks    Status Partially Met   05/23/20 - met for wrist extension, but not flexion   Target Date 05/18/21             OT Long Term Goals - 05/17/21 1745       OT LONG TERM GOAL #1   Title Pt will demonstrate independence w/ HEP designed for strength and ROM by d/c    Baseline No HEP at this time    Time 6    Period Weeks    Status On-going    Target Date 06/15/21      OT LONG TERM GOAL #2   Title Pt will achieve at least 75% of wrist flexion/extension ROM compared to R wrist to facilitate eventual return to weight-lifting activities    Baseline R wrist flexion/extension 65 and 67* respectively; L wrist flex/ext goal ~50*    Time 6    Period Weeks    Status  On-going    Target Date 06/15/21      OT LONG TERM GOAL #3   Title Pt will demonstrate L hand grip strength of at least 20 lbs by d/c    Baseline Unable to assess due to precautions    Time 6    Period Weeks    Status On-going    Target Date 06/15/21      OT LONG TERM GOAL #4   Title Pt will demonstrate ROM of L thumb WFL compared to R thumb    Baseline L thumb MCP flexion 55* (R thumb 83*)    Time 6    Period Weeks    Status On-going    Target Date 06/15/21      OT LONG TERM GOAL #5   Title Pt will improve functional use of LUE as evidenced by increasing QuickDASH score by at least 10 pts by d/c    Baseline TBA    Time 6    Period Weeks    Status On-going    Target Date 06/15/21             Plan - 05/30/21 1720     Clinical Impression Statement Pt is currently almost 11 weeks s/p SLL reconstruction w/ Dr. Tempie Donning on 03/16/21. Pt sent a message via MyChart last night (1/17) reporting sudden onset of irritation, edema, erythema, itchiness, and tightness in region of surgical scar. OT called and spoke w/ pt earlier today, advising pt to contact his surgeon as soon as able. Pt now has an appt scheduled for tomorrow AM and reported he preferred to attend therapy session this evening opposed to deferring until after follow-up appt w/ MD. Irritation observed to be raised, rough, and irregular w/ mild erythema also present; most prominant around pin removal side wrapping to palmar side of wrist and around base of thumb on dorsal side of hand. OT also observed restriction in movement (particularly wrist flexion) and evidence of discomfort w/ movement. Considering irritation and discomfort w/ wrist ROM, OT did not complete any passive stretching this session, focusing on education, answering pt questions as able, and reviewing DTM exercise for pt to incorporate into HEP if cleared by MD/able to complete w/out pain or discomfort. Session concluded early.    OT Occupational Profile and  History Problem Focused Assessment - Including review of records relating to presenting problem    Occupational performance deficits (Please refer to evaluation for details): ADL's;IADL's;Rest and Sleep;Work;Leisure    Body Structure / Function / Physical Skills Decreased knowledge of precautions;ROM;UE  functional use;Scar mobility;Body mechanics;Dexterity;Edema;GMC;Endurance;Pain;Strength    Rehab Potential Excellent    Clinical Decision Making Several treatment options, min-mod task modification necessary    Comorbidities Affecting Occupational Performance: None    Modification or Assistance to Complete Evaluation  Min-Moderate modification of tasks or assist with assess necessary to complete eval    OT Frequency 2x / week    OT Duration 6 weeks    OT Treatment/Interventions Self-care/ADL training;Electrical Stimulation;Iontophoresis;Therapeutic exercise;Moist Heat;Paraffin;Compression bandaging;Splinting;Patient/family education;Fluidtherapy;Scar mobilization;Therapeutic activities;Cryotherapy;Ultrasound;DME and/or AE instruction;Manual Therapy;Passive range of motion    Plan Dart-throwing exercise; continue gentle passive stretching of wrist and thumb MPJ    OT Home Exercise Plan MedBridge Code: [email or web address] - AROM of wrist flex/ext and radial/ulnar deviation; composite AROM of thumb flex/ext; wrist flex/ext and radial/ulnar devation AAROM w/ ball; wrist flex/ext PROM; putty squeezes; DTM exercise    Consulted and Agree with Plan of Care Patient            Patient will benefit from skilled therapeutic intervention in order to improve the following deficits and impairments:   Body Structure / Function / Physical Skills: Decreased knowledge of precautions, ROM, UE functional use, Scar mobility, Body mechanics, Dexterity, Edema, GMC, Endurance, Pain, Strength   Visit Diagnosis: Stiffness of left wrist, not elsewhere classified  Muscle weakness (generalized)  Other symptoms and signs  involving the musculoskeletal system  Localized edema  Pain in left wrist   Problem List Patient Active Problem List   Diagnosis Date Noted   Complete tear of scapholunate ligament    Left scapholunate ligament tear 03/05/2021   Chronic pain of left wrist 02/26/2021    Kathrine Cords, MSOT, OTR/L 05/30/2021, 6:15 PM  [facility]. [place], [place], [address] Phone: [phone]   Fax:  [phone]  Name: Montey Ebel MRN: [id number] Date of Birth: [birth year]

## 2021-05-31 NOTE — Progress Notes (Signed)
° °  Post-Op Visit Note   Patient: Ronald Swanson           Date of Birth: 05-18-1990           MRN: [id number] Visit Date: 05/31/2021 PCP: Morrell Riddle, PA-C   Assessment & Plan:  Chief Complaint:  Chief Complaint  Patient presents with   Left Wrist - Routine Post Op   Visit Diagnoses:  1. Pain in left wrist   2. Complete tear of scapholunate ligament     Plan: Patient called yesterday concerned about swelling and erythema of his wrist around the incision and the pin site that occurred a few days ago. He has no swelling or erythema today.  He has some mild dry skin and raised bumps around the pin site which could suggest an allergic reaction or atopic dermatitis.  He is doing well with therapy but continues to have wrist stiffness but without pain.  He will continue to work hard with therapy and I'll see him back in another month.   Follow-Up Instructions: No follow-ups on file.   Orders:  Orders Placed This Encounter  Procedures   XR Wrist Complete Left   No orders of the defined types were placed in this encounter.   Imaging: No results found.  PMFS History: Patient Active Problem List   Diagnosis Date Noted   Complete tear of scapholunate ligament    Left scapholunate ligament tear 03/05/2021   Chronic pain of left wrist 02/26/2021   History reviewed. No pertinent past medical history.  History reviewed. No pertinent family history.  Past Surgical History:  Procedure Laterality Date   LIGAMENT REPAIR Left 03/16/2021   Procedure: LEFT SCAPHOLUNATE LIGAMENT RECONSTRUCTION;  Surgeon: Marlyne Beards, MD;  Location: [facility];  Service: Orthopedics;  Laterality: Left;   Social History   Occupational History   Not on file  Tobacco Use   Smoking status: Never   Smokeless tobacco: Never  Vaping Use   Vaping Use: Never used  Substance and Sexual Activity   Alcohol use: Yes   Drug use: Yes    Types: Marijuana    Comment: occasionally   Sexual  activity: Not on file

## 2021-06-05 ENCOUNTER — Other Ambulatory Visit: Payer: Self-pay

## 2021-06-05 ENCOUNTER — Encounter: Payer: Self-pay | Admitting: Occupational Therapy

## 2021-06-05 ENCOUNTER — Ambulatory Visit: Payer: 59 | Admitting: Occupational Therapy

## 2021-06-05 DIAGNOSIS — M25632 Stiffness of left wrist, not elsewhere classified: Secondary | ICD-10-CM

## 2021-06-05 DIAGNOSIS — M25532 Pain in left wrist: Secondary | ICD-10-CM

## 2021-06-05 DIAGNOSIS — M6281 Muscle weakness (generalized): Secondary | ICD-10-CM

## 2021-06-05 DIAGNOSIS — R29898 Other symptoms and signs involving the musculoskeletal system: Secondary | ICD-10-CM

## 2021-06-05 DIAGNOSIS — R600 Localized edema: Secondary | ICD-10-CM | POA: Diagnosis not present

## 2021-06-05 NOTE — Patient Instructions (Addendum)
Activities requiring weighted resistance or a sustained grip, pinch or dynamic compression (e.g., push-up)/distraction (e.g., pulling a door) should be avoided until + 4 months postop Save the orthosis for activities requiring heavy resistance and compression/distraction of the wrist

## 2021-06-06 NOTE — Therapy (Signed)
Ronald Swanson, [place], [address] Phone: [phone]   Fax:  [phone]  Occupational Therapy Treatment  Patient Details  Name: Ronald Swanson MRN: [id number] Date of Birth: December 10, 1990 Referring Provider (OT): Lynda Rainwater, MD (Orthopedic Surgery)   Encounter Date: 06/05/2021   OT End of Session - 06/05/21 1708     Visit Number 9    Number of Visits 13    Date for OT Re-Evaluation 07/31/21    Authorization Type UMR    Authorization Time Period VL: 25 then authorization required    Authorization - Visit Number 7    Authorization - Number of Visits 25    OT Start Time 1701    OT Stop Time 1752   OT Time Calculation (min) 51 min    Activity Tolerance Patient tolerated treatment well    Behavior During Therapy [facility] for tasks assessed/performed            History reviewed. No pertinent past medical history.  Past Surgical History:  Procedure Laterality Date   LIGAMENT REPAIR Left 03/16/2021   Procedure: LEFT SCAPHOLUNATE LIGAMENT RECONSTRUCTION;  Surgeon: Sherilyn Cooter, MD;  Location: [facility];  Service: Orthopedics;  Laterality: Left;    There were no vitals filed for this visit.   Subjective Assessment - 06/05/21 1704     Subjective  Pt reports the irritation around his L wrist has improved since prior visit and MD was not concerned that it was related to the surgery    Pertinent History Left scapholunate ligament reconstruction 03/16/21 s/p complete SLL tear    Patient Stated Goals Get back to the gym/weight lifting    Currently in Pain? No/denies             OT Education - 06/05/21 1738     Education Details Education provided on current restrictions, providing demo and/or examples to facilitate understanding w/ handout administered to pt (see pt instructions). OT also discussed wear of orthosis - per protocol, able to d/c orthosis unless participating in activities  involving heavy resistance or chance of compression/distraction of the wrist - and provided recommendation of wearing prefab wrist support if pt remains uncomfortable sleeping w/out orthosis on   Person(s) Educated Patient    Methods Explanation;Demonstration;Handout    Comprehension Verbalized understanding;Returned demonstration            Treatment/Exercises - 06/05/21    PROM OT performed PROM and gentle passive stretching of wrist flex/ext, holding stretch at end-range during wrist flex, in order to increase ROM and decrease joint stiffness; able to achieve good wrist extension ROM and approx 30 degrees of wrist flex. Pt tolerated exercise w/out pain; mild crepitus palpated w/ initial reps that resolved w/ repetition  Reviewed self-stretching of wrist flexion/ext, focusing on positioning, alignment, and gentle and slow application of pressure vs forcing movement  Gentle PROM of radial wrist extension to ulnar wrist flexion (DTM) w/ no reported discomfort            OT Short Term Goals - 05/23/21 1738       OT SHORT TERM GOAL #1   Title Pt will verbalize understanding of wear and care of custom-fit orthosis    Time 1    Period Days    Status Achieved   05/02/21 - document signed and administered   Target Date 05/02/21      OT SHORT TERM GOAL #2   Title Pt will be able to achieve  mid-range AROM of wrist flexion/extension w/ discomfort less than 4/10    Baseline Approx 30 degrees of flex/ext    Time 2    Period Weeks    Status Partially Met   05/23/20 - met for wrist extension, but not flexion   Target Date 05/18/21             OT Long Term Goals - 05/17/21 1745       OT LONG TERM GOAL #1   Title Pt will demonstrate independence w/ HEP designed for strength and ROM by d/c    Baseline No HEP at this time    Time 6    Period Weeks    Status On-going    Target Date 06/15/21      OT LONG TERM GOAL #2   Title Pt will achieve at least 75% of wrist  flexion/extension ROM compared to R wrist to facilitate eventual return to weight-lifting activities    Baseline R wrist flexion/extension 65 and 67* respectively; L wrist flex/ext goal ~50*    Time 6    Period Weeks    Status On-going    Target Date 06/15/21      OT LONG TERM GOAL #3   Title Pt will demonstrate L hand grip strength of at least 20 lbs by d/c    Baseline Unable to assess due to precautions    Time 6    Period Weeks    Status On-going    Target Date 06/15/21      OT LONG TERM GOAL #4   Title Pt will demonstrate ROM of L thumb WFL compared to R thumb    Baseline L thumb MCP flexion 55* (R thumb 83*)    Time 6    Period Weeks    Status On-going    Target Date 06/15/21      OT LONG TERM GOAL #5   Title Pt will improve functional use of LUE as evidenced by increasing QuickDASH score by at least 10 pts by d/c    Baseline TBA    Time 6    Period Weeks    Status On-going    Target Date 06/15/21             Plan - 06/05/21 1710     Clinical Impression Statement Pt is currently almost 12 weeks s/p SLL reconstruction w/ Dr. Tempie Donning on 03/16/21. Pt is doing well, reports no pain, but persisent decreased wrist flexion. Also reported continued "popping" w/ AROM of wrist radial/ulnar deviation but his surgeon was not concerned, sensation is never painful, and seems to potentially be improving. OT able to achieve approx 30 degrees of wrist flexion PROM w/out pain; pt currently achieving 20 degrees of AROM. Per protocol, OT reviewed current restrictions and answered pt questions as thoroughly as able.   OT Occupational Profile and History Problem Focused Assessment - Including review of records relating to presenting problem    Occupational performance deficits (Please refer to evaluation for details): ADL's;IADL's;Rest and Sleep;Work;Leisure    Body Structure / Function / Physical Skills Decreased knowledge of precautions;ROM;UE functional use;Scar mobility;Body  mechanics;Dexterity;Edema;GMC;Endurance;Pain;Strength    Rehab Potential Excellent    Clinical Decision Making Several treatment options, min-mod task modification necessary    Comorbidities Affecting Occupational Performance: None    Modification or Assistance to Complete Evaluation  Min-Moderate modification of tasks or assist with assess necessary to complete eval    OT Frequency 2x / week    OT Duration 6 weeks  OT Treatment/Interventions Self-care/ADL training;Electrical Stimulation;Iontophoresis;Therapeutic exercise;Moist Heat;Paraffin;Compression bandaging;Splinting;Patient/family education;Fluidtherapy;Scar mobilization;Therapeutic activities;Cryotherapy;Ultrasound;DME and/or AE instruction;Manual Therapy;Passive range of motion    Plan Continue gentle passive stretching of wrist (focus on flex/ext and DTM) and thumb MPJ   OT Home Exercise Plan MedBridge Code: [email or web address] - AROM of wrist flex/ext and radial/ulnar deviation; composite AROM of thumb flex/ext; wrist flex/ext and radial/ulnar devation AAROM w/ ball; wrist flex/ext PROM; putty squeezes; DTM exercise    Consulted and Agree with Plan of Care Patient            Patient will benefit from skilled therapeutic intervention in order to improve the following deficits and impairments:   Body Structure / Function / Physical Skills: Decreased knowledge of precautions, ROM, UE functional use, Scar mobility, Body mechanics, Dexterity, Edema, GMC, Endurance, Pain, Strength   Visit Diagnosis: Stiffness of left wrist, not elsewhere classified  Muscle weakness (generalized)  Other symptoms and signs involving the musculoskeletal system  Localized edema  Pain in left wrist   Problem List Patient Active Problem List   Diagnosis Date Noted   Complete tear of scapholunate ligament    Left scapholunate ligament tear 03/05/2021   Chronic pain of left wrist 02/26/2021    Kathrine Cords, MSOT, OTR/L 06/06/2021, 5:39 PM  [facility]. [place], [place], [address] Phone: [phone]   Fax:  [phone]  Name: Ronald Swanson MRN: [id number] Date of Birth: Sep 11, 1990

## 2021-06-08 ENCOUNTER — Ambulatory Visit: Payer: 59 | Admitting: Occupational Therapy

## 2021-06-08 ENCOUNTER — Other Ambulatory Visit: Payer: Self-pay

## 2021-06-08 ENCOUNTER — Encounter: Payer: Self-pay | Admitting: Occupational Therapy

## 2021-06-08 DIAGNOSIS — R29898 Other symptoms and signs involving the musculoskeletal system: Secondary | ICD-10-CM | POA: Diagnosis not present

## 2021-06-08 DIAGNOSIS — M6281 Muscle weakness (generalized): Secondary | ICD-10-CM

## 2021-06-08 DIAGNOSIS — R600 Localized edema: Secondary | ICD-10-CM | POA: Diagnosis not present

## 2021-06-08 DIAGNOSIS — M25632 Stiffness of left wrist, not elsewhere classified: Secondary | ICD-10-CM

## 2021-06-08 DIAGNOSIS — M25532 Pain in left wrist: Secondary | ICD-10-CM | POA: Diagnosis not present

## 2021-06-08 NOTE — Therapy (Signed)
[place] [place], [place], [address] Phone: [phone]   Fax:  [phone]  Occupational Therapy Treatment  Patient Details  Name: Ronald Swanson MRN: [id number] Date of Birth: [birth year] Referring Provider (OT): Lynda Rainwater, MD (Orthopedic Surgery)   Encounter Date: 06/08/2021   OT End of Session - 06/08/21 0807     Visit Number 10    Number of Visits 13    Date for OT Re-Evaluation 07/31/21    Authorization Type UMR    Authorization Time Period VL: 25 then authorization required    Authorization - Visit Number 8    Authorization - Number of Visits 25    OT Start Time 0802    OT Stop Time 0852    OT Time Calculation (min) 50 min    Activity Tolerance Patient tolerated treatment well    Behavior During Therapy [facility] for tasks assessed/performed            History reviewed. No pertinent past medical history.  Past Surgical History:  Procedure Laterality Date   LIGAMENT REPAIR Left 03/16/2021   Procedure: LEFT SCAPHOLUNATE LIGAMENT RECONSTRUCTION;  Surgeon: Sherilyn Cooter, MD;  Location: [facility];  Service: Orthopedics;  Laterality: Left;    There were no vitals filed for this visit.   Subjective Assessment - 06/08/21 0806     Subjective  Pt reports he continues to feel uncomfortable w/ wrist radial/ulnar deviation exercises due to the "popping" sensation present when attempting AROM    Pertinent History Left scapholunate ligament reconstruction 03/16/21 s/p complete SLL tear    Patient Stated Goals Get back to the gym/weight lifting    Currently in Pain? No/denies             Treatment/Exercises - 06/08/21    Wrist Exercises  OT performed gentle passive stretching of wrist flex/ext, holding stretch at end-range to decrease stiffness and increase ROM; able to achieve approx 60 degrees of wrist ext (90%) and 30 degrees of wrist flex (45%). Pt tolerated exercise w/out pain;  mild crepitus palpated w/ initial reps of wrist extension that resolved quickly w/ repetition. Also completed passive stretching of radial wrist ext to ulnar wrist flex (DTM), holding stretch at end range w/ no discomfort w/ ulnar deviation and mild "soreness" reports w/ radial deviation (no difference w/ true rad dev vs radial wrist ext)  OT then completed gentle PROM 15x w/ each plane of movement w/out crepitus palpated or reported discomfort. Instructed pt on completion of AAROM w/ hands clasped and pt able to return demonstration w/out difficulty            OT Short Term Goals - 05/23/21 1738       OT SHORT TERM GOAL #1   Title Pt will verbalize understanding of wear and care of custom-fit orthosis    Time 1    Period Days    Status Achieved   05/02/21 - document signed and administered   Target Date 05/02/21      OT SHORT TERM GOAL #2   Title Pt will be able to achieve mid-range AROM of wrist flexion/extension w/ discomfort less than 4/10    Baseline Approx 30 degrees of flex/ext    Time 2    Period Weeks    Status Partially Met   05/23/20 - met for wrist extension, but not flexion   Target Date 05/18/21             OT Long  Term Goals - 06/08/21 0927       OT LONG TERM GOAL #1   Title Pt will demonstrate independence w/ HEP designed for strength and ROM by d/c    Baseline No HEP at this time    Time 6    Period Weeks    Status On-going    Target Date 06/15/21      OT LONG TERM GOAL #2   Title Pt will achieve at least 75% of wrist flexion/extension ROM compared to R wrist to facilitate eventual return to weight-lifting activities    Baseline R wrist flexion/extension 65 and 67* respectively; L wrist flex/ext goal ~50*    Time 6    Period Weeks    Status Partially Met   06/08/21 - met for wrist ext, but not flex   Target Date 06/15/21      OT LONG TERM GOAL #3   Title Pt will demonstrate L hand grip strength of at least 20 lbs by d/c    Baseline Unable to assess  due to precautions    Time 6    Period Weeks    Status On-going    Target Date 06/15/21      OT LONG TERM GOAL #4   Title Pt will demonstrate ROM of L thumb WFL compared to R thumb    Baseline L thumb MCP flexion 55* (R thumb 83*)    Time 6    Period Weeks    Status On-going    Target Date 06/15/21      OT LONG TERM GOAL #5   Title Pt will improve functional use of LUE as evidenced by QuickDASH score of < 20 by d/c    Baseline TBA    Time 6    Period Weeks    Status Revised    Target Date 06/15/21             Plan - 06/08/21 2458     Clinical Impression Statement Pt is currently almost 12 weeks s/p SLL reconstruction w/ Dr. Tempie Donning on 03/16/21. OT continued to facilitate PROM of wrist flexion/extension and DTM for increased mobility and decreased stiffness. Wrist flexion and radial deviation are the most limited at this time, but are slowly progressing. OT also reviewed current HEP, providing relevant education on benefit of warm/heat therapy for about 5 min prior to exercises and continued emphasis on not forcing movement/no forceful stretching. Upgraded putty to red (med-soft) w/ pt returning demonstration w/out difficulty or discomfort. Continued condition-specific education, answering pt questions regarding restrictions, orthosis/prefab wrist support wear, and continued progression as thoroughly as able.    OT Occupational Profile and History Problem Focused Assessment - Including review of records relating to presenting problem    Occupational performance deficits (Please refer to evaluation for details): ADL's;IADL's;Rest and Sleep;Work;Leisure    Body Structure / Function / Physical Skills Decreased knowledge of precautions;ROM;UE functional use;Scar mobility;Body mechanics;Dexterity;Edema;GMC;Endurance;Pain;Strength    Rehab Potential Excellent    Clinical Decision Making Several treatment options, min-mod task modification necessary    Comorbidities Affecting Occupational  Performance: None    Modification or Assistance to Complete Evaluation  Min-Moderate modification of tasks or assist with assess necessary to complete eval    OT Frequency 2x / week    OT Duration 6 weeks    OT Treatment/Interventions Self-care/ADL training;Electrical Stimulation;Iontophoresis;Therapeutic exercise;Moist Heat;Paraffin;Compression bandaging;Splinting;Patient/family education;Fluidtherapy;Scar mobilization;Therapeutic activities;Cryotherapy;Ultrasound;DME and/or AE instruction;Manual Therapy;Passive range of motion    Plan Continue gentle passive stretching of wrist (focus on flex/ext and  DTM) and thumb MPJ; introduce weighted resistance (1-3 lbs) for wrist strengthening pending physicial approval    OT Home Exercise Plan MedBridge Code: [email or web address] - AAROM wrist flex/ext; PROM of wrist flex/ext, holding stretch at end range; AAROM radial/ulnar deviation; AROM of DTM; putty squeezes    Consulted and Agree with Plan of Care Patient            Patient will benefit from skilled therapeutic intervention in order to improve the following deficits and impairments:   Body Structure / Function / Physical Skills: Decreased knowledge of precautions, ROM, UE functional use, Scar mobility, Body mechanics, Dexterity, Edema, GMC, Endurance, Pain, Strength   Visit Diagnosis: Stiffness of left wrist, not elsewhere classified  Other symptoms and signs involving the musculoskeletal system  Muscle weakness (generalized)   Problem List Patient Active Problem List   Diagnosis Date Noted   Complete tear of scapholunate ligament    Left scapholunate ligament tear 03/05/2021   Chronic pain of left wrist 02/26/2021    Kathrine Cords, MSOT, OTR/L 06/08/2021, 9:42 AM  [address] [place], [place], [address] Phone: [phone]   Fax:  [phone]  Name: Finnian Husted MRN: [id number] Date of Birth: 10-25-1990

## 2021-06-11 ENCOUNTER — Other Ambulatory Visit: Payer: Self-pay

## 2021-06-11 ENCOUNTER — Ambulatory Visit: Payer: 59 | Admitting: Occupational Therapy

## 2021-06-11 ENCOUNTER — Encounter: Payer: Self-pay | Admitting: Occupational Therapy

## 2021-06-11 DIAGNOSIS — M6281 Muscle weakness (generalized): Secondary | ICD-10-CM

## 2021-06-11 DIAGNOSIS — R29898 Other symptoms and signs involving the musculoskeletal system: Secondary | ICD-10-CM | POA: Diagnosis not present

## 2021-06-11 DIAGNOSIS — M25532 Pain in left wrist: Secondary | ICD-10-CM | POA: Diagnosis not present

## 2021-06-11 DIAGNOSIS — M25632 Stiffness of left wrist, not elsewhere classified: Secondary | ICD-10-CM | POA: Diagnosis not present

## 2021-06-11 DIAGNOSIS — R600 Localized edema: Secondary | ICD-10-CM | POA: Diagnosis not present

## 2021-06-12 NOTE — Therapy (Signed)
[place]. [place], [place], [address] Phone: [phone]   Fax:  [phone]  Occupational Therapy Treatment  Patient Details  Name: Ronald Swanson MRN: [id number] Date of Birth: [birth year] Referring Provider (OT): Lynda Rainwater, MD (Orthopedic Surgery)   Encounter Date: 06/11/2021   OT End of Session - 06/11/21 1709     Visit Number 11    Number of Visits 13    Date for OT Re-Evaluation 07/31/21    Authorization Type UMR    Authorization Time Period VL: 25 then authorization required    Authorization - Visit Number 9    Authorization - Number of Visits 25    OT Start Time 1601    OT Stop Time 0932    OT Time Calculation (min) 51 min    Activity Tolerance Patient tolerated treatment well    Behavior During Therapy [facility] for tasks assessed/performed            History reviewed. No pertinent past medical history.  Past Surgical History:  Procedure Laterality Date   LIGAMENT REPAIR Left 03/16/2021   Procedure: LEFT SCAPHOLUNATE LIGAMENT RECONSTRUCTION;  Surgeon: Sherilyn Cooter, MD;  Location: [place] [facility];  Service: Orthopedics;  Laterality: Left;    There were no vitals filed for this visit.   Subjective Assessment - 06/11/21 1704     Subjective  Pt reports he felt a throbbing pain along ulnar side of his hand on Sunday night; started about an hour after he did dishes and resolved this morning    Pertinent History Left scapholunate ligament reconstruction 03/16/21 s/p complete SLL tear    Patient Stated Goals Get back to the gym/weight lifting    Currently in Pain? No/denies             OT Education - 06/11/21 1831     Education Details Continued condition-specific education, reviewing current restrictions until instructed otherwise by MD    Person(s) Educated Patient    Methods Explanation    Comprehension Verbalized understanding             Treatment/Exercises -  06/11/21    Modality: Heat Pack At start of session, moist heat applied circumferentially to L wrist for 7 min to facilitate increased mobility prior to exercises    PROM OT performed gentle passive stretching of wrist flex/ext and radial/ulnar deviation, holding stretch at end-range to decrease stiffness and increase ROM. Completed 1 set of 10 reps each w/ no additional pain and no crepitus palpated this session. Able to achieve good wrist ext and improve wrist flexion, rad, and ulnar dev w/ rad dev now [facility] compared to R side.  Also performed gentle passive stretching of thumb MP flexion, holding stretch at end-range and gently adding composite wrist ulnar deviation for additional stretch; able to complete within tolerable level of discomfort.            [facility] OT Assessment - 06/11/21 1734       AROM   Right Wrist Extension 79 Degrees    Right Wrist Flexion 69 Degrees    Right Wrist Radial Deviation 29 Degrees    Right Wrist Ulnar Deviation 45 Degrees    Left Wrist Extension 62 Degrees    Left Wrist Flexion 30 Degrees    Left Wrist Radial Deviation 21 Degrees    Left Wrist Ulnar Deviation 26 Degrees             OT Short Term Goals - 06/11/21  [place] #1   Title Pt will verbalize understanding of wear and care of custom-fit orthosis    Time 1    Period Days    Status Achieved   05/02/21 - document signed and administered   Target Date 05/02/21      OT SHORT TERM GOAL #2   Title Pt will be able to achieve mid-range AROM of wrist flexion/extension w/ discomfort less than 4/10    Baseline Approx 30 degrees of flex/ext    Time 2    Period Weeks    Status Achieved   06/12/21 - met; 05/23/21 - met for wrist extension, but not flexion   Target Date 05/18/21             OT Long Term Goals - 06/08/21 0927       OT LONG TERM GOAL #1   Title Pt will demonstrate independence w/ HEP designed for strength and ROM by d/c    Baseline No HEP at this time     Time 6    Period Weeks    Status On-going    Target Date 06/15/21      OT LONG TERM GOAL #2   Title Pt will achieve at least 75% of wrist flexion/extension ROM compared to R wrist to facilitate eventual return to weight-lifting activities    Baseline R wrist flexion/extension 65 and 67* respectively; L wrist flex/ext goal ~50*    Time 6    Period Weeks    Status Partially Met   06/08/21 - met for wrist ext, but not flex   Target Date 06/15/21      OT LONG TERM GOAL #3   Title Pt will demonstrate L hand grip strength of at least 20 lbs by d/c    Baseline Unable to assess due to precautions    Time 6    Period Weeks    Status On-going    Target Date 06/15/21      OT LONG TERM GOAL #4   Title Pt will demonstrate ROM of L thumb WFL compared to R thumb    Baseline L thumb MCP flexion 55* (R thumb 83*)    Time 6    Period Weeks    Status On-going    Target Date 06/15/21      OT LONG TERM GOAL #5   Title Pt will improve functional use of LUE as evidenced by QuickDASH score of < 20 by d/c    Baseline TBA    Time 6    Period Weeks    Status Revised    Target Date 06/15/21             Plan - 06/11/21 1710     Clinical Impression Statement Pt is currently almost 12.5 weeks s/p SLL reconstruction w/ Dr. Tempie Donning on 03/16/21. Completed reassessment of AROM this session w/ pt demonstrating wrist radial deviation WFL compared to R side. R wrist AROM was also reassessed to ensure consistency due to observed differences compared to previous measurements. Pt continues to be most limited w/ wrist flexion and ulnar deviation. Also discussed potential to add wrist exercises against resistance, starting w/ very low weight, and progressing to increased resistance w/ MD clearance; pt agreeable to POC at this time.    OT Occupational Profile and History Problem Focused Assessment - Including review of records relating to presenting problem    Occupational performance deficits (Please refer to  evaluation  for details): ADL's;IADL's;Rest and Sleep;Work;Leisure    Body Structure / Function / Physical Skills Decreased knowledge of precautions;ROM;UE functional use;Scar mobility;Body mechanics;Dexterity;Edema;GMC;Endurance;Pain;Strength    Rehab Potential Excellent    Clinical Decision Making Several treatment options, min-mod task modification necessary    Comorbidities Affecting Occupational Performance: None    Modification or Assistance to Complete Evaluation  Min-Moderate modification of tasks or assist with assess necessary to complete eval    OT Frequency 2x / week    OT Duration 6 weeks    OT Treatment/Interventions Self-care/ADL training;Electrical Stimulation;Iontophoresis;Therapeutic exercise;Moist Heat;Paraffin;Compression bandaging;Splinting;Patient/family education;Fluidtherapy;Scar mobilization;Therapeutic activities;Cryotherapy;Ultrasound;DME and/or AE instruction;Manual Therapy;Passive range of motion    Plan Decrease to 1x/week; introduce weighted resistance (1-3 lbs) per MD clearance    OT Home Exercise Plan MedBridge Code: [email or web address] - AAROM wrist flex/ext; PROM of wrist flex/ext, holding stretch at end range; AAROM radial/ulnar deviation; AROM of DTM; putty squeezes    Consulted and Agree with Plan of Care Patient            Patient will benefit from skilled therapeutic intervention in order to improve the following deficits and impairments:   Body Structure / Function / Physical Skills: Decreased knowledge of precautions, ROM, UE functional use, Scar mobility, Body mechanics, Dexterity, Edema, GMC, Endurance, Pain, Strength   Visit Diagnosis: Stiffness of left wrist, not elsewhere classified  Other symptoms and signs involving the musculoskeletal system  Muscle weakness (generalized)  Pain in left wrist   Problem List Patient Active Problem List   Diagnosis Date Noted   Complete tear of scapholunate ligament    Left scapholunate ligament tear 03/05/2021    Chronic pain of left wrist 02/26/2021    Kathrine Cords, MSOT, OTR/L 06/11/2021, 6:33 PM  [place]. [place], [place], [address] Phone: [phone]   Fax:  [phone]  Name: Eustacio Ellen MRN: [id number] Date of Birth: 05-10-1991

## 2021-06-13 ENCOUNTER — Ambulatory Visit: Payer: 59 | Admitting: Occupational Therapy

## 2021-06-15 ENCOUNTER — Other Ambulatory Visit: Payer: Self-pay

## 2021-06-15 ENCOUNTER — Ambulatory Visit (INDEPENDENT_AMBULATORY_CARE_PROVIDER_SITE_OTHER): Payer: 59 | Admitting: Orthopedic Surgery

## 2021-06-15 DIAGNOSIS — S63592A Other specified sprain of left wrist, initial encounter: Secondary | ICD-10-CM | POA: Diagnosis not present

## 2021-06-15 DIAGNOSIS — S63599A Other specified sprain of unspecified wrist, initial encounter: Secondary | ICD-10-CM

## 2021-06-15 NOTE — Progress Notes (Signed)
Office Visit Note   Patient: Ronald Swanson           Date of Birth: January 16, 1991           MRN: [id number] Visit Date: 06/15/2021              Requested by: Morrell Riddle, PA-C [address] [place],  [place] [address] PCP: Morrell Riddle, PA-C   Assessment & Plan: Visit Diagnoses:  1. Complete tear of scapholunate ligament     Plan: Patient is now 13 weeks out status post left wrist SL reconstruction.  He has been doing extensive therapy.  He has made a lot of gains in terms of range of motion and has near symmetric extension to the contralateral side.  He does have somewhat limited flexion but still seeing improvements in therapy.  He has no pain in his wrist.  He is overall happy with his result so far and glad that he had the surgery.  We will keep him from sports and heavy lifting for another month or 2.  He can use the wrist now for nearly all activities during the day without the brace.  I can see him back in another month.  Follow-Up Instructions: No follow-ups on file.   Orders:  No orders of the defined types were placed in this encounter.  No orders of the defined types were placed in this encounter.     Procedures: No procedures performed   Clinical Data: No additional findings.   Subjective: Chief Complaint  Patient presents with   Left Wrist - Follow-up    Is a 31 year old right-hand-dominant male who presents for follow-up of a left wrist SL reconstruction approximate 13 weeks ago.  He injured his wrist while weightlifting and was having significant pain with any range of motion or rotation activities.  The SL ligament was not repairable in the OR and was reconstructed with an awl dorsal approach.  He is doing well today.  He has been doing extensive therapy.  He has made significant gains in his range of motion.  He has no pain in this wrist.  He is able to do all of his daily activities both at work and outside of work without difficulty.  He is  overall happy with his progress so far.   Review of Systems   Objective: Vital Signs: There were no vitals taken for this visit.  Physical Exam  Left Hand Exam   Tenderness  The patient is experiencing no tenderness.   Other  Erythema: absent Sensation: normal Pulse: present  Comments:  His previous swelling and erythema around the dorsal aspect of the hand has resolved.  He has no swelling of the wrist.  He has much improved range of motion of the wrist at 60/35 compared to 65/60 on the contralateral side.  He is able make a complete composite fist with normal strength.     Specialty Comments:  No specialty comments available.  Imaging: No results found.   PMFS History: Patient Active Problem List   Diagnosis Date Noted   Complete tear of scapholunate ligament    Left scapholunate ligament tear 03/05/2021   Chronic pain of left wrist 02/26/2021   No past medical history on file.  No family history on file.  Past Surgical History:  Procedure Laterality Date   LIGAMENT REPAIR Left 03/16/2021   Procedure: LEFT SCAPHOLUNATE LIGAMENT RECONSTRUCTION;  Surgeon: Marlyne Beards, MD;  Location: [facility];  Service: Orthopedics;  Laterality: Left;   Social History   Occupational History   Not on file  Tobacco Use   Smoking status: Never   Smokeless tobacco: Never  Vaping Use   Vaping Use: Never used  Substance and Sexual Activity   Alcohol use: Yes   Drug use: Yes    Types: Marijuana    Comment: occasionally   Sexual activity: Not on file

## 2021-06-18 ENCOUNTER — Ambulatory Visit: Payer: 59 | Attending: Orthopedic Surgery | Admitting: Occupational Therapy

## 2021-06-18 ENCOUNTER — Encounter: Payer: Self-pay | Admitting: Occupational Therapy

## 2021-06-18 ENCOUNTER — Other Ambulatory Visit: Payer: Self-pay

## 2021-06-18 DIAGNOSIS — M25632 Stiffness of left wrist, not elsewhere classified: Secondary | ICD-10-CM | POA: Insufficient documentation

## 2021-06-18 DIAGNOSIS — R29898 Other symptoms and signs involving the musculoskeletal system: Secondary | ICD-10-CM | POA: Diagnosis not present

## 2021-06-18 DIAGNOSIS — M6281 Muscle weakness (generalized): Secondary | ICD-10-CM | POA: Diagnosis not present

## 2021-06-19 NOTE — Therapy (Signed)
[place]. [place], [place], [address] Phone: [phone]   Fax:  [phone]  Occupational Therapy Treatment  Patient Details  Name: Ronald Swanson MRN: [id number] Date of Birth: 1991-04-20 Referring Provider (OT): Lynda Rainwater, MD (Orthopedic Surgery)   Encounter Date: 06/18/2021   OT End of Session - 06/18/21 1720     Visit Number 12    Number of Visits 13    Date for OT Re-Evaluation 07/31/21    Authorization Type UMR    Authorization Time Period VL: 25 then authorization required    Authorization - Visit Number 10    Authorization - Number of Visits 25    OT Start Time 5465    OT Stop Time 1754    OT Time Calculation (min) 50 min    Activity Tolerance Patient tolerated treatment well    Behavior During Therapy [facility] for tasks assessed/performed            History reviewed. No pertinent past medical history.  Past Surgical History:  Procedure Laterality Date   LIGAMENT REPAIR Left 03/16/2021   Procedure: LEFT SCAPHOLUNATE LIGAMENT RECONSTRUCTION;  Surgeon: Sherilyn Cooter, MD;  Location: [place];  Service: Orthopedics;  Laterality: Left;    There were no vitals filed for this visit.   Subjective Assessment - 06/18/21 1705     Subjective  Pt states his doctor told him he is cleared for most daily activities; he has started trying to lift grocery bags. Per MD note, "we will keep him from sports and heavy lifting for another month or 2"    Pertinent History Left scapholunate ligament reconstruction 03/16/21 s/p complete SLL tear    Patient Stated Goals Get back to the gym/weight lifting    Currently in Pain? No/denies             Treatment/Exercises - 06/18/21    Wrist Flexion/Extension  OT performed gentle PROM of wrist flex/ext, holding stretch at end-range to decrease stiffness and increase ROM. Completed 1 set of 10 reps each plane of movement w/ no additional pain and no  crepitus palpated this session. Able to achieve about wrist ext [facility] passively and about 50% of wrist flexion.    Wrist AROM Wrist flexion and extension w/ 1 lb dumbbell completed 2x15 w/ rest breaks between sets; education provided to avoid overuse of even this lightweight resistance and pt verbalized understanding            OT Education - 06/18/21 1718     Education Details Continued condition-specific education, particularly regarding progression through protocol and current restrictions (avoiding weighted resistance and activities requiring sustained grip, pinch, or dynamic compression/distraction) until cleared by MD    Person(s) Educated Patient    Methods Explanation    Comprehension Verbalized understanding             OT Short Term Goals - 06/11/21 1832       OT SHORT TERM GOAL #1   Title Pt will verbalize understanding of wear and care of custom-fit orthosis    Time 1    Period Days    Status Achieved   05/02/21 - document signed and administered   Target Date 05/02/21      OT SHORT TERM GOAL #2   Title Pt will be able to achieve mid-range AROM of wrist flexion/extension w/ discomfort less than 4/10    Baseline Approx 30 degrees of flex/ext    Time 2  Period Weeks    Status Achieved   06/12/21 - met; 05/23/21 - met for wrist extension, but not flexion   Target Date 05/18/21             OT Long Term Goals - 06/18/21 1707       OT LONG TERM GOAL #1   Title Pt will demonstrate independence w/ HEP designed for strength and ROM by d/c    Baseline No HEP at this time    Time 6    Period Weeks    Status Achieved   06/18/21   Target Date 06/15/21      OT LONG TERM GOAL #2   Title Pt will achieve at least 75% of wrist flexion/extension ROM compared to R wrist to facilitate eventual return to weight-lifting activities    Baseline R wrist flexion/extension 65 and 67* respectively; L wrist flex/ext goal ~50*    Time 6    Period Weeks    Status Partially Met    06/08/21 - met for wrist ext, but not flex   Target Date 06/15/21      OT LONG TERM GOAL #3   Title Pt will demonstrate L hand grip strength of at least 20 lbs by d/c    Baseline Unable to assess due to precautions    Time 6    Period Weeks    Status On-going    Target Date 06/15/21      OT LONG TERM GOAL #4   Title Pt will demonstrate ROM of L thumb WFL compared to R thumb    Baseline L thumb MCP flexion 55* (R thumb 83*)    Time 6    Period Weeks    Status On-going    Target Date 06/15/21      OT LONG TERM GOAL #5   Title Pt will improve functional use of LUE as evidenced by QuickDASH score of < 20 by d/c    Time 6    Period Weeks    Status On-going   06/19/21 - 20.5   Target Date 06/15/21             Plan - 06/18/21 1709     Clinical Impression Statement Pt is currently 13.5 weeks s/p SLL reconstruction w/ Dr. Tempie Donning on 03/16/21. Considering pt's progress and independence/compliance w/ HEP, OT discussed potential for upcoming d/c w/ pt who is agreeable at this time. OT continued w/ passive stretching for wrist flexion/extension and answered pt questions regarding potential for dynamic flexion orthosis as best as able. Wrist AROM of radial/ulnar deviation is currently [facility] and pt reports "popping" sensation has ceased. OT also introduced lightweight wrist flexion/extension (1 lb only at this time) per protocol due to pt's goal of ultimately returning to weight lifting.    OT Occupational Profile and History Problem Focused Assessment - Including review of records relating to presenting problem    Occupational performance deficits (Please refer to evaluation for details): ADL's;IADL's;Rest and Sleep;Work;Leisure    Body Structure / Function / Physical Skills Decreased knowledge of precautions;ROM;UE functional use;Scar mobility;Body mechanics;Dexterity;Edema;GMC;Endurance;Pain;Strength    Rehab Potential Excellent    Clinical Decision Making Several treatment options, min-mod task  modification necessary    Comorbidities Affecting Occupational Performance: None    Modification or Assistance to Complete Evaluation  Min-Moderate modification of tasks or assist with assess necessary to complete eval    OT Frequency 2x / week    OT Duration 6 weeks    OT Treatment/Interventions Self-care/ADL  training;Electrical Stimulation;Iontophoresis;Therapeutic exercise;Moist Heat;Paraffin;Compression bandaging;Splinting;Patient/family education;Fluidtherapy;Scar mobilization;Therapeutic activities;Cryotherapy;Ultrasound;DME and/or AE instruction;Manual Therapy;Passive range of motion    Plan OT to reach out to MD about modified weight training / flexion brace; upgrade weighted wrist exercises and d/c prn    OT Home Exercise Plan MedBridge Code: [email or web address] - AAROM wrist flex/ext; PROM of wrist flex/ext, holding stretch at end range; AAROM radial/ulnar deviation; AROM of DTM; putty squeezes    Consulted and Agree with Plan of Care Patient            Patient will benefit from skilled therapeutic intervention in order to improve the following deficits and impairments:   Body Structure / Function / Physical Skills: Decreased knowledge of precautions, ROM, UE functional use, Scar mobility, Body mechanics, Dexterity, Edema, GMC, Endurance, Pain, Strength   Visit Diagnosis: Stiffness of left wrist, not elsewhere classified  Other symptoms and signs involving the musculoskeletal system  Muscle weakness (generalized)   Problem List Patient Active Problem List   Diagnosis Date Noted   Complete tear of scapholunate ligament    Left scapholunate ligament tear 03/05/2021   Chronic pain of left wrist 02/26/2021    Kathrine Cords, MSOT, OTR/L 06/18/2021, 5:42 PM  [place]. [place], [place], [address] Phone: [phone]   Fax:  [phone]  Name: Ronald Swanson MRN: [id number] Date of Birth: 1990/11/14

## 2021-06-27 ENCOUNTER — Encounter: Payer: Self-pay | Admitting: Occupational Therapy

## 2021-06-27 ENCOUNTER — Ambulatory Visit: Payer: 59 | Admitting: Occupational Therapy

## 2021-06-27 ENCOUNTER — Other Ambulatory Visit: Payer: Self-pay

## 2021-06-27 DIAGNOSIS — R29898 Other symptoms and signs involving the musculoskeletal system: Secondary | ICD-10-CM | POA: Diagnosis not present

## 2021-06-27 DIAGNOSIS — M25632 Stiffness of left wrist, not elsewhere classified: Secondary | ICD-10-CM

## 2021-06-27 DIAGNOSIS — M6281 Muscle weakness (generalized): Secondary | ICD-10-CM | POA: Diagnosis not present

## 2021-06-28 NOTE — Therapy (Signed)
[place]. [place], [place], [address] Phone: [phone]   Fax:  [phone]  Occupational Therapy Treatment & Recertification  Patient Details  Name: Ronald Swanson MRN: [id number] Date of Birth: [birth year] Referring Provider (OT): Lynda Rainwater, MD (Orthopedic Surgery)   Encounter Date: 06/27/2021   OT End of Session - 06/27/21 1703     Visit Number 13    Number of Visits 17   Date for OT Re-Evaluation 07/31/21    Authorization Type UMR    Authorization Time Period VL: 25 then authorization required    Authorization - Visit Number 11   Authorization - Number of Visits 25    OT Start Time 1700    OT Stop Time 1745    OT Time Calculation (min) 45 min    Activity Tolerance Patient tolerated treatment well    Behavior During Therapy [facility] for tasks assessed/performed            History reviewed. No pertinent past medical history.  Past Surgical History:  Procedure Laterality Date   LIGAMENT REPAIR Left 03/16/2021   Procedure: LEFT SCAPHOLUNATE LIGAMENT RECONSTRUCTION;  Surgeon: Sherilyn Cooter, MD;  Location: [place];  Service: Orthopedics;  Laterality: Left;    There were no vitals filed for this visit.   Subjective Assessment - 06/27/21 1703     Subjective  Pt reports he has started using his L hand more comfortably, but that the ROM does not seem notably improved since prior session    Pertinent History Left scapholunate ligament reconstruction 03/16/21 s/p complete SLL tear    Patient Stated Goals Get back to the gym/weight lifting    Currently in Pain? No/denies             OT Treatment - 06/27/21    Weighted Wrist Stretch L wrist weighted stretch w/ forearm on arm elevation wedge, holding 1 lb dumbbell for 5 min; OT provided relevant education on positioning and frequency to include exercise in current HEP    Modalities: Heat Pack Moist heat applied circumferentially to  L wrist for 7 min to facilitate increased mobility prior to exercises    PROM OT performed gentle PROM of wrist flexion, holding stretch at end-range to decrease stiffness and increase ROM. Completed 1 set of 10 reps w/ no additional pain and no crepitus palpated this session.    Wrist Strengthening Wrist flexion and extension w/ 2 lb dumbbell completed 1x15 each; education provided to avoid overuse of even this lightweight resistance and pt verbalized understanding    Orthosis Management Discussed wear of orthosis - per protocol, able to d/c orthosis unless participating in activities involving heavy resistance or chance of compression/distraction of the wrist; also provided handout for option for self-purchase of prefab compression wrist support to wear at nighttime if pt remains uncomfortable sleeping w/out orthosis on            OT Short Term Goals - 06/11/21 1832       OT SHORT TERM GOAL #1   Title Pt will verbalize understanding of wear and care of custom-fit orthosis    Time 1    Period Days    Status Achieved   05/02/21 - document signed and administered   Target Date 05/02/21      OT SHORT TERM GOAL #2   Title Pt will be able to achieve mid-range AROM of wrist flexion/extension w/ discomfort less than 4/10    Baseline Approx 30 degrees  of flex/ext    Time 2    Period Weeks    Status Achieved   06/12/21 - met; 05/23/21 - met for wrist extension, but not flexion   Target Date 05/18/21             OT Long Term Goals - 06/18/21 1707       OT LONG TERM GOAL #1   Title Pt will demonstrate independence w/ HEP designed for strength and ROM by d/c    Baseline No HEP at this time    Time 6    Period Weeks    Status Achieved   06/18/21   Target Date 06/15/21      OT LONG TERM GOAL #2   Title Pt will achieve at least 75% of wrist flexion/extension ROM compared to R wrist to facilitate eventual return to weight-lifting activities    Baseline R wrist flexion/extension 65 and  67* respectively; L wrist flex/ext goal ~50*    Time 6    Period Weeks    Status Partially Met   06/08/21 - met for wrist ext, but not flex   Target Date 06/15/21      OT LONG TERM GOAL #3   Title Pt will demonstrate L hand grip strength of at least 20 lbs by d/c    Baseline Unable to assess due to precautions    Time 6    Period Weeks    Status On-going    Target Date 06/15/21      OT LONG TERM GOAL #4   Title Pt will demonstrate ROM of L thumb WFL compared to R thumb    Baseline L thumb MCP flexion 55* (R thumb 83*)    Time 6    Period Weeks    Status On-going    Target Date 06/15/21      OT LONG TERM GOAL #5   Title Pt will improve functional use of LUE as evidenced by QuickDASH score of < 20 by d/c    Time 6    Period Weeks    Status On-going   06/19/21 - 20.5   Target Date 06/15/21             Plan - 06/27/21 1734     Clinical Impression Statement Pt is currently almost 15 weeks s/p SLL reconstruction w/ Dr. Tempie Donning on 03/16/21. Pt continues to report increased participation in functional activities at home, but ROM of wrist flexion remains limited. OT did discuss potential appropriateness of dynamic wrist flex/ext brace s/p SLL reconstruction w/ CHT who works w/ Dr. Robina Ade at [place] [facility]); OT recommended continuing to pursue more aggressive manaul therapy vs dynamic brace. OT able to facilitate increased wrist flexion by 9 degrees between start of session and conclusion of session (32 degrees vs 41 degrees). Pt will benefit from skilled OT services for approximately 4 more weeks to continue to maximize wrist ROM and continue slow, progressive strengthening under therapy guidance in order to facilitate safe return to pt's ultimate goal of resuming weight lifting.    OT Occupational Profile and History Problem Focused Assessment - Including review of records relating to presenting problem    Occupational performance deficits (Please refer to  evaluation for details): ADL's;IADL's;Rest and Sleep;Work;Leisure    Body Structure / Function / Physical Skills Decreased knowledge of precautions;ROM;UE functional use;Scar mobility;Body mechanics;Dexterity;Edema;GMC;Endurance;Pain;Strength    Rehab Potential Excellent    Clinical Decision Making Several treatment options, min-mod task modification necessary    Comorbidities  Affecting Occupational Performance: None    Modification or Assistance to Complete Evaluation  Min-Moderate modification of tasks or assist with assess necessary to complete eval    OT Frequency 2x / week    OT Duration 6 weeks    OT Treatment/Interventions Self-care/ADL training;Electrical Stimulation;Iontophoresis;Therapeutic exercise;Moist Heat;Paraffin;Compression bandaging;Splinting;Patient/family education;Fluidtherapy;Scar mobilization;Therapeutic activities;Cryotherapy;Ultrasound;DME and/or AE instruction;Manual Therapy;Passive range of motion    Plan Continue w/ manual stretching (glove method, taping, weighted stretch, etc); heat pack   OT Home Exercise Plan MedBridge Code: [email or web address]   Consulted and Agree with Plan of Care Patient            Patient will benefit from skilled therapeutic intervention in order to improve the following deficits and impairments:   Body Structure / Function / Physical Skills: Decreased knowledge of precautions, ROM, UE functional use, Scar mobility, Body mechanics, Dexterity, Edema, GMC, Endurance, Pain, Strength   Visit Diagnosis: Stiffness of left wrist, not elsewhere classified  Other symptoms and signs involving the musculoskeletal system  Muscle weakness (generalized)   Problem List Patient Active Problem List   Diagnosis Date Noted   Complete tear of scapholunate ligament    Left scapholunate ligament tear 03/05/2021   Chronic pain of left wrist 02/26/2021    Kathrine Cords, MSOT, OTR/L 06/27/2021, 6:32 PM  [place]. [place], [place], [address] Phone: [phone]   Fax:  [phone]  Name: Ronald Swanson MRN: [id number] Date of Birth: 11-03-1990

## 2021-07-04 ENCOUNTER — Ambulatory Visit: Payer: 59 | Admitting: Occupational Therapy

## 2021-07-04 ENCOUNTER — Other Ambulatory Visit: Payer: Self-pay

## 2021-07-04 DIAGNOSIS — M6281 Muscle weakness (generalized): Secondary | ICD-10-CM

## 2021-07-04 DIAGNOSIS — M25632 Stiffness of left wrist, not elsewhere classified: Secondary | ICD-10-CM | POA: Diagnosis not present

## 2021-07-04 DIAGNOSIS — R29898 Other symptoms and signs involving the musculoskeletal system: Secondary | ICD-10-CM | POA: Diagnosis not present

## 2021-07-04 NOTE — Patient Instructions (Signed)
Weighted Wrist Flexion/Extension & Radial/Ulnar Deviation  - Use 2-3 lbs; once that feels comfortable, complete with same weight for about 2 days and then increase weight to 3-4 lbs...then 4-5 lbs and so on - Do not exceed 8 lbs at this time unless cleared by your doctor

## 2021-07-04 NOTE — Addendum Note (Signed)
Addended by: Rosie Fate on: 07/04/2021 03:48 PM   Modules accepted: Orders

## 2021-07-05 NOTE — Therapy (Signed)
[place]. [place], [place], [address] Phone: [phone]   Fax:  [phone]  Occupational Therapy Treatment  Patient Details  Name: Ronald Swanson MRN: [id number] Date of Birth: 02/15/1991 Referring Provider (OT): Lynda Rainwater, MD (Orthopedic Surgery)   Encounter Date: 07/04/2021   OT End of Session - 07/04/21 1703     Visit Number 14    Number of Visits 17    Date for OT Re-Evaluation 07/31/21    Authorization Type UMR    Authorization Time Period VL: 25 then authorization required    Authorization - Visit Number 12    Authorization - Number of Visits 25    OT Start Time 0240    OT Stop Time 1755   OT Time Calculation (min) 57 min    Activity Tolerance Patient tolerated treatment well    Behavior During Therapy [facility] for tasks assessed/performed            No past medical history on file.  Past Surgical History:  Procedure Laterality Date   LIGAMENT REPAIR Left 03/16/2021   Procedure: LEFT SCAPHOLUNATE LIGAMENT RECONSTRUCTION;  Surgeon: Sherilyn Cooter, MD;  Location: [place];  Service: Orthopedics;  Laterality: Left;    There were no vitals filed for this visit.   Subjective Assessment - 07/04/21 1659     Subjective  Pt reports feeling like the weighted stretch has been really helpful; also reports he self-purchased a brace that he has been sleeping in and feels like that has helped a little bit w/ the stiffness in the morning    Pertinent History Left scapholunate ligament reconstruction 03/16/21 s/p complete SLL tear    Patient Stated Goals Get back to the gym/weight lifting    Currently in Pain? No/denies             OT Treatment - 07/04/21    Passive Stretching  OT performed PROM of wrist flexion and extension, holding each stretch at end-range to decrease stiffness and increase ROM. Completed 1 set of 10 reps w/ each plane of movements. No additional pain. Between  sets, moist heat applied circumferentially to L wrist for 7 min to facilitate increased mobility prior to second set focusing on passive flexion, again holding stretch at end-range.    Wrist Radial/Ulnar Deviation Wrist radial/ulnar deviation w/ 2 lb dumbbell completed 15x slowly; education provided to avoid overuse of even this lightweight resistance and pt verbalized understanding            OT Short Term Goals - 06/11/21 1832       OT SHORT TERM GOAL #1   Title Pt will verbalize understanding of wear and care of custom-fit orthosis    Time 1    Period Days    Status Achieved   05/02/21 - document signed and administered   Target Date 05/02/21      OT SHORT TERM GOAL #2   Title Pt will be able to achieve mid-range AROM of wrist flexion/extension w/ discomfort less than 4/10    Baseline Approx 30 degrees of flex/ext    Time 2    Period Weeks    Status Achieved   06/12/21 - met; 05/23/21 - met for wrist extension, but not flexion   Target Date 05/18/21             OT Long Term Goals - 07/04/21 1707       OT LONG TERM GOAL #1   Title Pt  will demonstrate independence w/ HEP designed for strength and ROM by d/c    Baseline No HEP at this time    Time 6    Period Weeks    Status Achieved   06/18/21   Target Date 06/15/21      OT LONG TERM GOAL #2   Title Pt will achieve at least 75% of wrist flexion/extension ROM compared to R wrist to facilitate eventual return to weight-lifting activities    Baseline R wrist flexion/extension 65 and 67* respectively; L wrist flex/ext goal ~50*    Time 6    Period Weeks    Status Partially Met   06/08/21 - met for wrist ext, but not flex   Target Date 06/15/21      OT LONG TERM GOAL #3   Title Pt will demonstrate L hand grip strength of at least 20 lbs by d/c    Baseline Unable to assess due to precautions    Time 6    Period Weeks    Status On-going    Target Date 06/15/21      OT LONG TERM GOAL #4   Title Pt will demonstrate ROM  of L thumb WFL compared to R thumb    Baseline L thumb MCP flexion 55* (R thumb 83*)    Time 6    Period Weeks    Status Achieved   07/04/21 - 71 degrees   Target Date 06/15/21      OT LONG TERM GOAL #5   Title Pt will improve functional use of LUE as evidenced by QuickDASH score of < 20 by d/c    Time 6    Period Weeks    Status On-going   06/19/21 - 20.5   Target Date 06/15/21             Plan - 07/04/21 1806     Clinical Impression Statement Pt is currently almost 16 weeks s/p SLL reconstruction w/ Dr. Tempie Donning on 03/16/21. Continued benefit of skilled OT services including manual therapy and progression of HEP as appropriate. At start of session, AROM of L wrist flexion measured at 33 degrees; able to achieve 40 degrees at conclusion of session. Increased capacity for wrist extension, which will be important when pt is cleared to return to weight lifting, as pt is able to achieve 65 degrees actively vs 77 degrees passively. Pt to return to OT after follow-up appt w/ MD next week; OT assisted pt w/ writing down questions from pt that OT has been unable to answer at this point (e.g., lifting restrictions, when he is cleared for weighted or prolonged wrist compression or distraction). OT reviewed and provided continuing education regarding current known restrictions w/ pt verbalizing understanding.   OT Occupational Profile and History Problem Focused Assessment - Including review of records relating to presenting problem    Occupational performance deficits (Please refer to evaluation for details): ADL's;IADL's;Rest and Sleep;Work;Leisure    Body Structure / Function / Physical Skills Decreased knowledge of precautions;ROM;UE functional use;Scar mobility;Body mechanics;Dexterity;Edema;GMC;Endurance;Pain;Strength    Rehab Potential Excellent    Clinical Decision Making Several treatment options, min-mod task modification necessary    Comorbidities Affecting Occupational Performance: None     Modification or Assistance to Complete Evaluation  Min-Moderate modification of tasks or assist with assess necessary to complete eval    OT Frequency 2x / week    OT Duration 6 weeks    OT Treatment/Interventions Self-care/ADL training;Electrical Stimulation;Iontophoresis;Therapeutic exercise;Moist Heat;Paraffin;Compression bandaging;Splinting;Patient/family education;Fluidtherapy;Scar mobilization;Therapeutic activities;Cryotherapy;Ultrasound;DME and/or  AE instruction;Manual Therapy;Passive range of motion    Plan Reassess progress and update POC after MD follow-up visit prn    OT Home Exercise Plan MedBridge Code: [email or web address]    Consulted and Agree with Plan of Care Patient            Patient will benefit from skilled therapeutic intervention in order to improve the following deficits and impairments:   Body Structure / Function / Physical Skills: Decreased knowledge of precautions, ROM, UE functional use, Scar mobility, Body mechanics, Dexterity, Edema, GMC, Endurance, Pain, Strength   Visit Diagnosis: Stiffness of left wrist, not elsewhere classified  Other symptoms and signs involving the musculoskeletal system  Muscle weakness (generalized)   Problem List Patient Active Problem List   Diagnosis Date Noted   Complete tear of scapholunate ligament    Left scapholunate ligament tear 03/05/2021   Chronic pain of left wrist 02/26/2021    Kathrine Cords, MSOT, OTR/L 07/05/2021, 1:07 PM  [place]. [place], [place], [address] Phone: [phone]   Fax:  [phone]  Name: Ronald Swanson MRN: [id number] Date of Birth: 04-25-1991

## 2021-07-13 ENCOUNTER — Other Ambulatory Visit: Payer: Self-pay

## 2021-07-13 ENCOUNTER — Ambulatory Visit (INDEPENDENT_AMBULATORY_CARE_PROVIDER_SITE_OTHER): Payer: 59 | Admitting: Orthopedic Surgery

## 2021-07-13 DIAGNOSIS — S63599A Other specified sprain of unspecified wrist, initial encounter: Secondary | ICD-10-CM

## 2021-07-13 DIAGNOSIS — S63592A Other specified sprain of left wrist, initial encounter: Secondary | ICD-10-CM

## 2021-07-13 NOTE — Progress Notes (Signed)
? ?  Office Visit Note ?  ?Patient: Ronald Swanson           ?Date of Birth: 05-21-1990           ?MRN: [id number] ?Visit Date: 07/13/2021 ?             ?Requested by: Morrell Riddle, PA-C ?[phone] ?[address] ?[place],  [place] [address] ?PCP: Morrell Riddle, PA-C ? ? ?Assessment & Plan: ?Visit Diagnoses:  ?1. Complete tear of scapholunate ligament   ? ? ?Plan: Patient now approximately 4 months out from surgery and doing very well.  He has been working hard in therapy working on ROM.  He can start increasing his resistance and weight training.  He does not need any brace.  He can see me back in a few months.  ? ?Follow-Up Instructions: No follow-ups on file.  ? ?Orders:  ?No orders of the defined types were placed in this encounter. ? ?No orders of the defined types were placed in this encounter. ? ? ? ? Procedures: ?No procedures performed ? ? ?Clinical Data: ?No additional findings. ? ? ?Subjective: ?Chief Complaint  ?Patient presents with  ? Left Wrist - Follow-up  ?  SL reconstruction  ? ? ?Is a 31 year old right-hand-dominant male who presents for follow-up of a left wrist SL reconstruction approximate 16 weeks ago.  He is doing well today.  He's been working hard in therapy.  He has started lifting things around the house with this hand and has no pain or difficulty.  He is not wearing any brace.  He is interested in easing back into weight lifting.  ? ? ?Review of Systems ? ? ?Objective: ?Vital Signs: There were no vitals taken for this visit. ? ?Physical Exam ? ?Left Hand Exam  ? ?Tenderness  ?The patient is experiencing no tenderness.  ? ?Range of Motion  ?Wrist  ?Left wrist extension: 55.  ?Left wrist flexion: 40 (60 on c/l side)  ? ?Other  ?Erythema: absent ?Sensation: normal ?Pulse: present ? ? ? ? ?Specialty Comments:  ?No specialty comments available. ? ?Imaging: ?No results found. ? ? ?PMFS History: ?Patient Active Problem List  ? Diagnosis Date Noted  ? Complete tear of scapholunate ligament   ? Left  scapholunate ligament tear 03/05/2021  ? Chronic pain of left wrist 02/26/2021  ? ?No past medical history on file.  ?No family history on file.  ?Past Surgical History:  ?Procedure Laterality Date  ? LIGAMENT REPAIR Left 03/16/2021  ? Procedure: LEFT SCAPHOLUNATE LIGAMENT RECONSTRUCTION;  Surgeon: Marlyne Beards, MD;  Location: [facility];  Service: Orthopedics;  Laterality: Left;  ? ?Social History  ? ?Occupational History  ? Not on file  ?Tobacco Use  ? Smoking status: Never  ? Smokeless tobacco: Never  ?Vaping Use  ? Vaping Use: Never used  ?Substance and Sexual Activity  ? Alcohol use: Yes  ? Drug use: Yes  ?  Types: Marijuana  ?  Comment: occasionally  ? Sexual activity: Not on file  ? ? ? ? ? ? ?

## 2021-07-19 ENCOUNTER — Encounter: Payer: Self-pay | Admitting: Occupational Therapy

## 2021-07-19 ENCOUNTER — Ambulatory Visit: Payer: 59 | Attending: Orthopedic Surgery | Admitting: Occupational Therapy

## 2021-07-19 ENCOUNTER — Other Ambulatory Visit: Payer: Self-pay

## 2021-07-19 DIAGNOSIS — M25532 Pain in left wrist: Secondary | ICD-10-CM | POA: Diagnosis not present

## 2021-07-19 DIAGNOSIS — M25632 Stiffness of left wrist, not elsewhere classified: Secondary | ICD-10-CM | POA: Insufficient documentation

## 2021-07-19 DIAGNOSIS — M6281 Muscle weakness (generalized): Secondary | ICD-10-CM | POA: Insufficient documentation

## 2021-07-19 DIAGNOSIS — R29898 Other symptoms and signs involving the musculoskeletal system: Secondary | ICD-10-CM | POA: Insufficient documentation

## 2021-07-19 DIAGNOSIS — R600 Localized edema: Secondary | ICD-10-CM | POA: Diagnosis not present

## 2021-07-20 NOTE — Therapy (Signed)
[place] [place], [place], [address] Phone: [phone]   Fax:  [phone]  Occupational Therapy Treatment  Patient Details  Name: Ronald Swanson MRN: [id number] Date of Birth: Mar 08, 1991 Referring Provider (OT): Lynda Rainwater, MD (Orthopedic Surgery)   Encounter Date: 07/19/2021   OT End of Session - 07/19/21 1707     Visit Number 15    Number of Visits 17    Date for OT Re-Evaluation 07/31/21    Authorization Type UMR    Authorization Time Period VL: 25 then authorization required    Authorization - Visit Number 13    Authorization - Number of Visits 25    OT Start Time 1700    OT Stop Time 1751    OT Time Calculation (min) 51 min    Activity Tolerance Patient tolerated treatment well    Behavior During Therapy [facility] for tasks assessed/performed            History reviewed. No pertinent past medical history.  Past Surgical History:  Procedure Laterality Date   LIGAMENT REPAIR Left 03/16/2021   Procedure: LEFT SCAPHOLUNATE LIGAMENT RECONSTRUCTION;  Surgeon: Sherilyn Cooter, MD;  Location: [facility];  Service: Orthopedics;  Laterality: Left;    There were no vitals filed for this visit.   Subjective Assessment - 07/19/21 1702     Subjective  Pt reports he has been doing a lot more w/ his LUE since prior session    Pertinent History Left scapholunate ligament reconstruction 03/16/21 s/p complete SLL tear    Patient Stated Goals Get back to the gym/weight lifting    Currently in Pain? No/denies             OT Education - 07/19/21 1707     Education Details Continued condition-specific education, answering pt questions as best as able, particularly regarding continued progression of protocol and current movement/lifting precautions; e.g., gradually progressing weighted resistance and activities requiring sustained grip, pinch, or dynamic compression/distraction). Also provided  education related to prefab wrist brace and/or support; pt does not need to wear brace unless for comfort w/ potential benefit of wrist support during progressive return to leisure tasks    Person(s) Educated Patient    Methods Explanation;Demonstration    Comprehension Verbalized understanding             OT Treatment - 07/19/21    Self-Stretching Sustained self-stretch of L wrist flex w/ elbow on tabletop and pt applying very gentle pressure w/ contralateral UE; 5 min. OT provided relevant education on positioning options, OT recommending completing exercise in gravity minimized position w/ forearm flat and wrist over edge of surface    Wrist Exercises L wrist flex, ext, rad/ulnar dev w/ 4 lb dumbbell; 1x20 each w/ no adverse reactions. Education on safe progressive grading of exercises up to 6 lbs w/ HEP; pt verbalized understanding.    IADLs: Leisure Problem-solved w/ pt to identify compensatory strategies, adaptive techniques and safety considerations w/ increasing resistance and weight lifting, per MD clearance received 07/13/21            OT Short Term Goals - 06/11/21 1832       OT SHORT TERM GOAL #1   Title Pt will verbalize understanding of wear and care of custom-fit orthosis    Time 1    Period Days    Status Achieved   05/02/21 - document signed and administered   Target Date 05/02/21  OT SHORT TERM GOAL #2   Title Pt will be able to achieve mid-range AROM of wrist flexion/extension w/ discomfort less than 4/10    Baseline Approx 30 degrees of flex/ext    Time 2    Period Weeks    Status Achieved   06/12/21 - met; 05/23/21 - met for wrist extension, but not flexion   Target Date 05/18/21             OT Long Term Goals - 07/04/21 1707       OT LONG TERM GOAL #1   Title Pt will demonstrate independence w/ HEP designed for strength and ROM by d/c    Baseline No HEP at this time    Time 6    Period Weeks    Status Achieved   06/18/21   Target Date  06/15/21      OT LONG TERM GOAL #2   Title Pt will achieve at least 75% of wrist flexion/extension ROM compared to R wrist to facilitate eventual return to weight-lifting activities    Baseline R wrist flexion/extension 65 and 67* respectively; L wrist flex/ext goal ~50*    Time 6    Period Weeks    Status Partially Met   06/08/21 - met for wrist ext, but not flex   Target Date 06/15/21      OT LONG TERM GOAL #3   Title Pt will demonstrate L hand grip strength of at least 20 lbs by d/c    Baseline Unable to assess due to precautions    Time 6    Period Weeks    Status On-going    Target Date 06/15/21      OT LONG TERM GOAL #4   Title Pt will demonstrate ROM of L thumb WFL compared to R thumb    Baseline L thumb MCP flexion 55* (R thumb 83*)    Time 6    Period Weeks    Status Achieved   07/04/21 - 71 degrees   Target Date 06/15/21      OT LONG TERM GOAL #5   Title Pt will improve functional use of LUE as evidenced by QuickDASH score of < 20 by d/c    Time 6    Period Weeks    Status On-going   06/19/21 - 20.5   Target Date 06/15/21             Plan - 07/19/21 1709     Clinical Impression Statement Pt is currently almost 18 weeks s/p SLL reconstruction w/ Dr. Tempie Donning on 03/16/21 and is doing very well, reporting increased return to PLOF since prior visit. Pt attended follow-up w/ MD who cleared him to start increasing resistance and weight training, but pt remains hesitant to return to weighted activities. OT encouraged pt to trial exercises w/ lighter weight and progressively increase resitance if able to complete exercises w/out difficulty and no increase in pain. OT also guided pt again through progressive strengthening of wrist exercises included in pt-specific HEP; pt able to complete exercises w/ 4 lbs of resistance w/out discomfort and was instructed to trial up to 7 lbs of resistance prior to next OT visit. AROM of wrist flexion measured at 40 degrees and OT facilitated  gentle prolonged stretch of wrist flexion, emphasizing importance of consistent frequency of 3-4x / daily for maximum benefit, w/ pt returning demonstration in session to increase comfort w/ pt carryover to home.    OT Occupational Profile and History Problem Focused  Assessment - Including review of records relating to presenting problem    Occupational performance deficits (Please refer to evaluation for details): ADL's;IADL's;Rest and Sleep;Work;Leisure    Body Structure / Function / Physical Skills Decreased knowledge of precautions;ROM;UE functional use;Scar mobility;Body mechanics;Dexterity;Edema;GMC;Endurance;Pain;Strength    Rehab Potential Excellent    Clinical Decision Making Several treatment options, min-mod task modification necessary    Comorbidities Affecting Occupational Performance: None    Modification or Assistance to Complete Evaluation  Min-Moderate modification of tasks or assist with assess necessary to complete eval    OT Frequency 2x / week    OT Duration 6 weeks    OT Treatment/Interventions Self-care/ADL training;Electrical Stimulation;Iontophoresis;Therapeutic exercise;Moist Heat;Paraffin;Compression bandaging;Splinting;Patient/family education;Fluidtherapy;Scar mobilization;Therapeutic activities;Cryotherapy;Ultrasound;DME and/or AE instruction;Manual Therapy;Passive range of motion    Plan Reassess AROM; progress wrist strengthening    OT Home Exercise Plan MedBridge Code: [email or web address]    Consulted and Agree with Plan of Care Patient            Patient will benefit from skilled therapeutic intervention in order to improve the following deficits and impairments:   Body Structure / Function / Physical Skills: Decreased knowledge of precautions, ROM, UE functional use, Scar mobility, Body mechanics, Dexterity, Edema, GMC, Endurance, Pain, Strength   Visit Diagnosis: Stiffness of left wrist, not elsewhere classified  Other symptoms and signs involving the  musculoskeletal system  Muscle weakness (generalized)  Pain in left wrist    Problem List Patient Active Problem List   Diagnosis Date Noted   Complete tear of scapholunate ligament    Left scapholunate ligament tear 03/05/2021   Chronic pain of left wrist 02/26/2021    Kathrine Cords, MSOT, OTR/L  07/19/2021, 5:53 PM  [place]. [place], [place], [address] Phone: [phone]   Fax:  [phone]  Name: Ronald Swanson MRN: [id number] Date of Birth: 1990-07-16

## 2021-08-02 ENCOUNTER — Other Ambulatory Visit: Payer: Self-pay

## 2021-08-02 ENCOUNTER — Ambulatory Visit: Payer: 59 | Admitting: Occupational Therapy

## 2021-08-02 ENCOUNTER — Encounter: Payer: Self-pay | Admitting: Occupational Therapy

## 2021-08-02 DIAGNOSIS — R600 Localized edema: Secondary | ICD-10-CM | POA: Diagnosis not present

## 2021-08-02 DIAGNOSIS — R29898 Other symptoms and signs involving the musculoskeletal system: Secondary | ICD-10-CM

## 2021-08-02 DIAGNOSIS — M25532 Pain in left wrist: Secondary | ICD-10-CM | POA: Diagnosis not present

## 2021-08-02 DIAGNOSIS — M6281 Muscle weakness (generalized): Secondary | ICD-10-CM

## 2021-08-02 DIAGNOSIS — M25632 Stiffness of left wrist, not elsewhere classified: Secondary | ICD-10-CM | POA: Diagnosis not present

## 2021-08-03 NOTE — Therapy (Signed)
[facility] ?[place], [place], [address] ?Phone: [phone]   Fax:  [phone] ? ?Occupational Therapy Treatment & Discharge Summary ? ?Patient Details  ?Name: Ronald Swanson ?MRN: [id number] ?Date of Birth: 1990-12-02 ?Referring Provider (OT): Lynda Rainwater, MD (Orthopedic Surgery) ? ? ?Encounter Date: 08/02/2021 ? ? OT End of Session - 08/02/21 1701   ? ? Visit Number 16   ? Number of Visits 17   ? Date for OT Re-Evaluation 08/03/21   ? Authorization Type UMR   ? Authorization Time Period VL: 25 then authorization required   ? Authorization - Visit Number 14   ? Authorization - Number of Visits 25   ? OT Start Time 3295   ? OT Stop Time 1884   ? OT Time Calculation (min) 47 min   ? Activity Tolerance Patient tolerated treatment well   ? Behavior During Therapy [facility] for tasks assessed/performed   ? ?  ?  ? ?  ? ?OCCUPATIONAL THERAPY DISCHARGE SUMMARY ? ?Visits from Start of Care: 16 ? ?Current functional level related to goals / functional outcomes: ?Pt reports he is able to complete functional activities w/ Mod I or Ind w/out pain; AROM of hand and wrist extension, rad dev, and ulnar dev are WNL w/ wrist flexion WFL (achieving approx 75% of ROM); grip strength WFL; and QuickDASH improved by 16 ?  ?Remaining deficits: ?Mild decreased AROM and stiffness w/ R wrist flexion ?  ?Education / Equipment: ?Condition-specific education; wear and care of custom-fit orthosis and prefabricated support brace; HEP  ? ?Patient agrees to discharge. Patient goals were met. Patient is being discharged due to meeting the stated rehab goals. ? ? ?History reviewed. No pertinent past medical history. ? ?Past Surgical History:  ?Procedure Laterality Date  ? LIGAMENT REPAIR Left 03/16/2021  ? Procedure: LEFT SCAPHOLUNATE LIGAMENT RECONSTRUCTION;  Surgeon: Sherilyn Cooter, MD;  Location: [facility];  Service: Orthopedics;  Laterality: Left;  ? ? ?There were  no vitals filed for this visit. ? ? Subjective Assessment - 08/02/21 1700   ? ? Subjective  "I'm in the gym actively"   ? Pertinent History Left scapholunate ligament reconstruction 03/16/21 s/p complete SLL tear   ? Patient Stated Goals Get back to the gym/weight lifting   ? Currently in Pain? No/denies   ? ?  ?  ? ?  ? ? OT Education - 08/02/21 1831   ? ? Education Details Continued condition-specific education, answering pt questions as best as able. Reviewed all self-stretching for wrist/forearm w/ pt returning demonstration of all exercises and printed handout administered; continued progression of strengthening (e.g., gradually progressing weighted resistance and precaution w/ activities requiring sustained grip, pinch, or dynamic compression/distraction); and various compensatory strategies, adaptive techniques, and safety considerations w/ increasing resistance and weight while lifting weights at the gym   ? Person(s) Educated Patient   ? Methods Explanation;Demonstration;Handout   ? Comprehension Verbalized understanding   ? ?  ?  ? ?  ? ? OT Treatment - 08/02/21  ? ? PROM OT performed PROM of L wrist flexion and extension, holding each stretch at end-range to decrease stiffness and increase ROM. Completed 1 set of 10 reps w/ each plane of movement. No additional pain. AROM of wrist flexion increased by 7 degrees after OT completed stretching.  ? ?  ?  ? ?  ? ? AROM Assessment - 08/02/21  ? ? ? Initial Measurement D/C Measurement  ?L Wrist  Flexion 11  52  ?L Wrist Extension  39  61  ?L Wrist Radial Deviation  10  21  ?L Wrist Ulnar Deviation  17  30  ? ?  ?  ? ?  ? ? OT Short Term Goals - 06/11/21 1832   ? ?  ? OT SHORT TERM GOAL #1  ? Title Pt will verbalize understanding of wear and care of custom-fit orthosis   ? Time 1   ? Period Days   ? Status Achieved   05/02/21 - document signed and administered  ? Target Date 05/02/21   ?  ? OT SHORT TERM GOAL #2  ? Title Pt will be able to achieve mid-range AROM of  wrist flexion/extension w/ discomfort less than 4/10   ? Baseline Approx 30 degrees of flex/ext   ? Time 2   ? Period Weeks   ? Status Achieved   06/12/21 - met; 05/23/21 - met for wrist extension, but not flexion  ? Target Date 05/18/21   ? ?  ?  ? ?  ? ? OT Long Term Goals - 08/02/21 1713   ? ?  ? OT LONG TERM GOAL #1  ? Title Pt will demonstrate independence w/ HEP designed for strength and ROM by d/c   ? Baseline No HEP at this time   ? Time 6   ? Period Weeks   ? Status Achieved   06/18/21  ? Target Date 06/15/21   ?  ? OT LONG TERM GOAL #2  ? Title Pt will achieve at least 75% of wrist flexion/extension ROM compared to R wrist to facilitate eventual return to weight-lifting activities   ? Baseline R wrist flexion/extension 65 and 67* respectively; L wrist flex/ext goal ~50*   ? Time 6   ? Period Weeks   ? Status Achieved   08/02/21 - 52 degrees of wrist flexion; 06/08/21 - met for wrist ext, but not flex  ? Target Date 06/15/21   ?  ? OT LONG TERM GOAL #3  ? Title Pt will demonstrate L hand grip strength of at least 20 lbs by d/c   ? Baseline Unable to assess due to precautions   ? Time 6   ? Period Weeks   ? Status Achieved   08/02/21  ? Target Date 06/15/21   ?  ? OT LONG TERM GOAL #4  ? Title Pt will demonstrate ROM of L thumb WFL compared to R thumb   ? Baseline L thumb MCP flexion 55* (R thumb 83*)   ? Time 6   ? Period Weeks   ? Status Achieved   07/04/21 - 71 degrees  ? Target Date 06/15/21   ?  ? OT LONG TERM GOAL #5  ? Title Pt will improve functional use of LUE as evidenced by QuickDASH score of < 20 by d/c   ? Time 6   ? Period Weeks   ? Status Achieved   08/02/21 - 4.5; 06/19/21 - 20.5  ? Target Date 06/15/21   ? ?  ?  ? ?  ? ? Plan - 08/02/21 1709   ? ? Clinical Impression Statement Ronald Swanson is a 31 y/o male who has been seen in OP OT for L wrist pain and stiffness s/p complete tear of SLL and subsequent SLL reconstruction w/ Dr. Tempie Donning on 03/16/21; pt is currently 20 weeks post-op and doing very well. Pt  has shown improvements in AROM, strength, and understanding of education  provided w/ all STGs and LTGs met. Per pt report, he has returned to the gym, slowly/progressively returning to weight lifting activities w/out difficulty. Pt is appropriate for d/c from skilled OT to HEP at this time; OT provided relevant education in preparation for d/c and encouraged pt to contact OT w/ any concerns or questions prn. Pt reports he is satisfied with progress and is currently agreeable to discharge plan.   ? OT Occupational Profile and History Problem Focused Assessment - Including review of records relating to presenting problem   ? Occupational performance deficits (Please refer to evaluation for details): ADL's;IADL's;Rest and Sleep;Work;Leisure   ? Body Structure / Function / Physical Skills Decreased knowledge of precautions;ROM;UE functional use;Scar mobility;Body mechanics;Dexterity;Edema;GMC;Endurance;Pain;Strength   ? Rehab Potential Excellent   ? Clinical Decision Making Several treatment options, min-mod task modification necessary   ? Comorbidities Affecting Occupational Performance: None   ? Modification or Assistance to Complete Evaluation  Min-Moderate modification of tasks or assist with assess necessary to complete eval   ? OT Frequency 2x / week   ? OT Duration 6 weeks   ? OT Treatment/Interventions Self-care/ADL training;Electrical Stimulation;Iontophoresis;Therapeutic exercise;Moist Heat;Paraffin;Compression bandaging;Splinting;Patient/family education;Fluidtherapy;Scar mobilization;Therapeutic activities;Cryotherapy;Ultrasound;DME and/or AE instruction;Manual Therapy;Passive range of motion   ? Plan D/C   ? OT Home Exercise Plan MedBridge Code: [email or web address]   ? Consulted and Agree with Plan of Care Patient   ? ?  ?  ? ?  ? ?Patient will benefit from skilled therapeutic intervention in order to improve the following deficits and impairments:   ?Body Structure / Function / Physical Skills: Decreased knowledge of  precautions, ROM, UE functional use, Scar mobility, Body mechanics, Dexterity, Edema, GMC, Endurance, Pain, Strength ? ? ?Visit Diagnosis: ?Other symptoms and signs involving the musculoskeletal syst

## 2021-08-17 DIAGNOSIS — R0789 Other chest pain: Secondary | ICD-10-CM | POA: Diagnosis not present

## 2021-08-17 DIAGNOSIS — T7840XA Allergy, unspecified, initial encounter: Secondary | ICD-10-CM | POA: Diagnosis not present

## 2021-08-17 DIAGNOSIS — J45909 Unspecified asthma, uncomplicated: Secondary | ICD-10-CM | POA: Diagnosis not present

## 2021-08-17 DIAGNOSIS — R0602 Shortness of breath: Secondary | ICD-10-CM | POA: Diagnosis not present

## 2021-09-13 ENCOUNTER — Ambulatory Visit: Payer: 59 | Admitting: Orthopedic Surgery

## 2021-09-25 ENCOUNTER — Ambulatory Visit (INDEPENDENT_AMBULATORY_CARE_PROVIDER_SITE_OTHER): Payer: 59 | Admitting: Orthopedic Surgery

## 2021-09-25 DIAGNOSIS — S63599A Other specified sprain of unspecified wrist, initial encounter: Secondary | ICD-10-CM | POA: Diagnosis not present

## 2021-09-25 NOTE — Progress Notes (Signed)
? ?  Office Visit Note ?  ?Patient: Ronald Swanson           ?Date of Birth: [birth year]           ?MRN: [id number] ?Visit Date: 09/25/2021 ?             ?Requested by: Morrell Riddle, PA-C ?[phone] ?[address] ?[place],  [place] [address] ?PCP: Morrell Riddle, PA-C ? ? ?Assessment & Plan: ?Visit Diagnoses:  ?1. Complete tear of scapholunate ligament   ? ? ?Plan: Patient is now approximately 6 months out from left SL reconstruction.  He is doing very well and is back in the gym working on strengthening. He has no issues at work or at home.  He can follow up with me again as needed.  ? ?Follow-Up Instructions: No follow-ups on file.  ? ?Orders:  ?No orders of the defined types were placed in this encounter. ? ?No orders of the defined types were placed in this encounter. ? ? ? ? Procedures: ?No procedures performed ? ? ?Clinical Data: ?No additional findings. ? ? ?Subjective: ?Chief Complaint  ?Patient presents with  ? Left Wrist - Follow-up  ?  Follow up from Scapholunate Ligament repair on 03/16/2021-- doing good, no complaints with it  ? ? ?This is a 31 yo M s/p left SL reconstruction 6 months ago.  He is doing very well.  He has no wrist pain.  He is happy with his ROM. He has returned to the gym.  He has no complaints today.  ? ? ?Review of Systems ? ? ?Objective: ?Vital Signs: There were no vitals taken for this visit. ? ?Physical Exam ? ?Left Hand Exam  ? ?Tenderness  ?The patient is experiencing no tenderness.  ? ?Other  ?Erythema: absent ?Sensation: normal ?Pulse: present ? ?Comments:  Wrist ROM 60/40 vs 70/55 on contralateral side.  Full fist with symmetric grip strength.  Incision well healed.  ? ? ? ? ?Specialty Comments:  ?No specialty comments available. ? ?Imaging: ?No results found. ? ? ?PMFS History: ?Patient Active Problem List  ? Diagnosis Date Noted  ? Complete tear of scapholunate ligament   ? Left scapholunate ligament tear 03/05/2021  ? Chronic pain of left wrist 02/26/2021  ? ?No past medical  history on file.  ?No family history on file.  ?Past Surgical History:  ?Procedure Laterality Date  ? LIGAMENT REPAIR Left 03/16/2021  ? Procedure: LEFT SCAPHOLUNATE LIGAMENT RECONSTRUCTION;  Surgeon: Marlyne Beards, MD;  Location: [facility];  Service: Orthopedics;  Laterality: Left;  ? ?Social History  ? ?Occupational History  ? Not on file  ?Tobacco Use  ? Smoking status: Never  ? Smokeless tobacco: Never  ?Vaping Use  ? Vaping Use: Never used  ?Substance and Sexual Activity  ? Alcohol use: Yes  ? Drug use: Yes  ?  Types: Marijuana  ?  Comment: occasionally  ? Sexual activity: Not on file  ? ? ? ? ? ? ?

## 2021-12-06 ENCOUNTER — Ambulatory Visit (INDEPENDENT_AMBULATORY_CARE_PROVIDER_SITE_OTHER): Payer: 59

## 2021-12-06 ENCOUNTER — Ambulatory Visit (INDEPENDENT_AMBULATORY_CARE_PROVIDER_SITE_OTHER): Payer: 59 | Admitting: Orthopedic Surgery

## 2021-12-06 DIAGNOSIS — M25531 Pain in right wrist: Secondary | ICD-10-CM

## 2021-12-10 NOTE — Progress Notes (Signed)
Office Visit Note   Patient: Ronald Swanson           Date of Birth: 1991/04/20           MRN: [id number] Visit Date: 12/06/2021              Requested by: Morrell Riddle, PA-C [address] [place],  [place] [address] PCP: Morrell Riddle, PA-C   Assessment & Plan: Visit Diagnoses:  1. Pain in right wrist     Plan: Patient describes pain in central dorsal aspect of right wrist that started on Sunday.  He has no SL diastasis on x-ray.  There is no obvious swelling.  Discussed starting with a period of immobilization and oral NSAID use.  I can see him back after two weeks of immobilization to see how he's doing.   Follow-Up Instructions: No follow-ups on file.   Orders:  Orders Placed This Encounter  Procedures   XR Wrist Complete Right   No orders of the defined types were placed in this encounter.     Procedures: No procedures performed   Clinical Data: No additional findings.   Subjective: Chief Complaint  Patient presents with   Right Wrist - Pain    Pain in same area as the left one but not as bad as it the left, Right is sore and stiff    This is a 31 year old male who presents with pain in the right wrist.  This started on Sunday.  Was doing some dumbbell presses when he noticed pain at the dorsal central aspect of the wrist.  He did not feel any pop in the wrist like he did with the left side.  He had no pain in the wrist prior to this weightlifting session.  His pain has been as bad as 8/10.  He said his right wrist is somewhat stiff since the pain started.  He denies any issue with the left wrist.    Review of Systems   Objective: Vital Signs: There were no vitals taken for this visit.  Physical Exam Constitutional:      Appearance: Normal appearance.  Cardiovascular:     Rate and Rhythm: Normal rate.     Pulses: Normal pulses.  Pulmonary:     Effort: Pulmonary effort is normal.  Skin:    General: Skin is warm and dry.     Capillary  Refill: Capillary refill takes less than 2 seconds.  Neurological:     Mental Status: He is alert.     Right Hand Exam   Tenderness  Right hand tenderness location: Mildly TTP at dorsal central wrist and SL interval.  Range of Motion  The patient has normal right wrist ROM.   Other  Erythema: absent Sensation: normal Pulse: present      Specialty Comments:  No specialty comments available.  Imaging: No results found.   PMFS History: Patient Active Problem List   Diagnosis Date Noted   Complete tear of scapholunate ligament    Left scapholunate ligament tear 03/05/2021   Chronic pain of left wrist 02/26/2021   No past medical history on file.  No family history on file.  Past Surgical History:  Procedure Laterality Date   LIGAMENT REPAIR Left 03/16/2021   Procedure: LEFT SCAPHOLUNATE LIGAMENT RECONSTRUCTION;  Surgeon: Marlyne Beards, MD;  Location: [facility];  Service: Orthopedics;  Laterality: Left;   Social History   Occupational History   Not on file  Tobacco Use  Smoking status: Never   Smokeless tobacco: Never  Vaping Use   Vaping Use: Never used  Substance and Sexual Activity   Alcohol use: Yes   Drug use: Yes    Types: Marijuana    Comment: occasionally   Sexual activity: Not on file

## 2021-12-11 ENCOUNTER — Telehealth: Payer: Self-pay | Admitting: Orthopedic Surgery

## 2021-12-11 ENCOUNTER — Other Ambulatory Visit: Payer: Self-pay | Admitting: Orthopedic Surgery

## 2021-12-11 MED ORDER — DICLOFENAC SODIUM 75 MG PO TBEC: 75.0000 mg | DELAYED_RELEASE_TABLET | Freq: Two times a day (BID) | ORAL | 0 refills | Status: AC

## 2021-12-11 NOTE — Telephone Encounter (Signed)
Patient called advised the antiinflammatory Rx is not showing received at his pharmacy. [facility] uses [facility] of [address]    [address]   Ph# is [phone]    The  number  to contact patient is [phone]

## 2021-12-17 ENCOUNTER — Ambulatory Visit: Payer: 59 | Admitting: Orthopedic Surgery

## 2021-12-28 ENCOUNTER — Other Ambulatory Visit (HOSPITAL_COMMUNITY): Payer: Self-pay

## 2021-12-28 MED ORDER — VALACYCLOVIR HCL 500 MG PO TABS
ORAL_TABLET | ORAL | 3 refills | Status: DC
  Filled 2021-12-28: qty 90, 30d supply, fill #0

## 2021-12-28 MED ORDER — VALACYCLOVIR HCL 500 MG PO TABS
ORAL_TABLET | ORAL | 4 refills | Status: AC
  Filled 2021-12-28: qty 90, 45d supply, fill #0
  Filled 2021-12-31: qty 90, 75d supply, fill #0
  Filled 2022-01-01: qty 90, 30d supply, fill #0

## 2021-12-31 ENCOUNTER — Other Ambulatory Visit (HOSPITAL_COMMUNITY): Payer: Self-pay

## 2022-01-01 ENCOUNTER — Other Ambulatory Visit (HOSPITAL_COMMUNITY): Payer: Self-pay

## 2022-01-02 ENCOUNTER — Other Ambulatory Visit (HOSPITAL_COMMUNITY): Payer: Self-pay

## 2022-01-02 ENCOUNTER — Other Ambulatory Visit: Payer: Self-pay

## 2022-01-24 DIAGNOSIS — Z13228 Encounter for screening for other metabolic disorders: Secondary | ICD-10-CM | POA: Diagnosis not present

## 2022-01-24 DIAGNOSIS — H6123 Impacted cerumen, bilateral: Secondary | ICD-10-CM | POA: Diagnosis not present

## 2022-01-24 DIAGNOSIS — Z0000 Encounter for general adult medical examination without abnormal findings: Secondary | ICD-10-CM | POA: Diagnosis not present

## 2022-01-24 DIAGNOSIS — Z1389 Encounter for screening for other disorder: Secondary | ICD-10-CM | POA: Diagnosis not present

## 2022-01-24 DIAGNOSIS — B009 Herpesviral infection, unspecified: Secondary | ICD-10-CM | POA: Diagnosis not present

## 2022-01-24 DIAGNOSIS — Z130 Encounter for screening for diseases of the blood and blood-forming organs and certain disorders involving the immune mechanism: Secondary | ICD-10-CM | POA: Diagnosis not present

## 2022-01-24 DIAGNOSIS — Z13220 Encounter for screening for lipoid disorders: Secondary | ICD-10-CM | POA: Diagnosis not present

## 2022-05-14 DIAGNOSIS — S63391A Traumatic rupture of other ligament of right wrist, initial encounter: Secondary | ICD-10-CM | POA: Diagnosis not present

## 2022-05-14 DIAGNOSIS — M67831 Other specified disorders of synovium, right wrist: Secondary | ICD-10-CM | POA: Diagnosis not present

## 2022-06-14 DIAGNOSIS — M67831 Other specified disorders of synovium, right wrist: Secondary | ICD-10-CM | POA: Diagnosis not present

## 2022-06-14 DIAGNOSIS — S63391A Traumatic rupture of other ligament of right wrist, initial encounter: Secondary | ICD-10-CM | POA: Diagnosis not present

## 2022-08-07 DIAGNOSIS — H5213 Myopia, bilateral: Secondary | ICD-10-CM | POA: Diagnosis not present

## 2022-08-07 DIAGNOSIS — H52223 Regular astigmatism, bilateral: Secondary | ICD-10-CM | POA: Diagnosis not present

## 2022-09-26 DIAGNOSIS — Z1331 Encounter for screening for depression: Secondary | ICD-10-CM | POA: Diagnosis not present

## 2022-09-26 DIAGNOSIS — Z23 Encounter for immunization: Secondary | ICD-10-CM | POA: Diagnosis not present

## 2022-09-26 DIAGNOSIS — Z111 Encounter for screening for respiratory tuberculosis: Secondary | ICD-10-CM | POA: Diagnosis not present

## 2022-09-26 DIAGNOSIS — Z0184 Encounter for antibody response examination: Secondary | ICD-10-CM | POA: Diagnosis not present

## 2022-10-01 DIAGNOSIS — Z23 Encounter for immunization: Secondary | ICD-10-CM | POA: Diagnosis not present

## 2022-10-10 DIAGNOSIS — Z111 Encounter for screening for respiratory tuberculosis: Secondary | ICD-10-CM | POA: Diagnosis not present

## 2023-03-13 ENCOUNTER — Encounter (HOSPITAL_BASED_OUTPATIENT_CLINIC_OR_DEPARTMENT_OTHER): Payer: Self-pay

## 2023-03-13 ENCOUNTER — Other Ambulatory Visit: Payer: Self-pay

## 2023-03-13 ENCOUNTER — Emergency Department (HOSPITAL_BASED_OUTPATIENT_CLINIC_OR_DEPARTMENT_OTHER)
Admission: EM | Admit: 2023-03-13 | Discharge: 2023-03-13 | Disposition: A | Discharge status: Home / Self Care | Payer: Self-pay | Attending: Emergency Medicine | Admitting: Emergency Medicine

## 2023-03-13 ENCOUNTER — Other Ambulatory Visit (HOSPITAL_BASED_OUTPATIENT_CLINIC_OR_DEPARTMENT_OTHER): Payer: Self-pay

## 2023-03-13 ENCOUNTER — Emergency Department (HOSPITAL_BASED_OUTPATIENT_CLINIC_OR_DEPARTMENT_OTHER): Payer: Self-pay

## 2023-03-13 DIAGNOSIS — R0602 Shortness of breath: Secondary | ICD-10-CM | POA: Insufficient documentation

## 2023-03-13 DIAGNOSIS — R7989 Other specified abnormal findings of blood chemistry: Secondary | ICD-10-CM | POA: Insufficient documentation

## 2023-03-13 DIAGNOSIS — R079 Chest pain, unspecified: Secondary | ICD-10-CM | POA: Insufficient documentation

## 2023-03-13 LAB — BASIC METABOLIC PANEL
Anion gap: 6 (ref 5–15)
BUN: 15 mg/dL (ref 6–20)
CO2: 28 mmol/L (ref 22–32)
Calcium: 9.6 mg/dL (ref 8.9–10.3)
Chloride: 105 mmol/L (ref 98–111)
Creatinine, Ser: 1.28 mg/dL — ABNORMAL HIGH (ref 0.61–1.24)
GFR, Estimated: >60 mL/min (ref >60)
Glucose, Bld: 109 mg/dL — ABNORMAL HIGH (ref 70–99)
Potassium: 4.1 mmol/L (ref 3.5–5.1)
Sodium: 139 mmol/L (ref 135–145)

## 2023-03-13 LAB — CBC WITH DIFFERENTIAL/PLATELET
Abs Immature Granulocytes: 0.02 10*3/uL (ref 0.00–0.07)
Basophils Absolute: 0 10*3/uL (ref 0.0–0.1)
Basophils Relative: 0 %
Eosinophils Absolute: 0.1 10*3/uL (ref 0.0–0.5)
Eosinophils Relative: 2 %
HCT: 43.1 % (ref 39.0–52.0)
Hemoglobin: 14.4 g/dL (ref 13.0–17.0)
Immature Granulocytes: 0 %
Lymphocytes Relative: 24 %
Lymphs Abs: 1.3 10*3/uL (ref 0.7–4.0)
MCH: 28.5 pg (ref 26.0–34.0)
MCHC: 33.4 g/dL (ref 30.0–36.0)
MCV: 85.2 fL (ref 80.0–100.0)
Monocytes Absolute: 0.5 10*3/uL (ref 0.1–1.0)
Monocytes Relative: 9 %
Neutro Abs: 3.7 10*3/uL (ref 1.7–7.7)
Neutrophils Relative %: 65 %
Platelets: 196 10*3/uL (ref 150–400)
RBC: 5.06 MIL/uL (ref 4.22–5.81)
RDW: 12.1 % (ref 11.5–15.5)
WBC: 5.6 10*3/uL (ref 4.0–10.5)
nRBC: 0 % (ref 0.0–0.2)

## 2023-03-13 LAB — TROPONIN I (HIGH SENSITIVITY)
Troponin I (High Sensitivity): 38 ng/L — ABNORMAL HIGH (ref <18)
Troponin I (High Sensitivity): 33 ng/L — ABNORMAL HIGH (ref <18)

## 2023-03-13 MED ORDER — ASPIRIN 81 MG PO CHEW
324.0000 mg | CHEWABLE_TABLET | Freq: Once | ORAL | Status: AC
  Administered 2023-03-13: 324 mg via ORAL
  Filled 2023-03-13: qty 4

## 2023-03-13 NOTE — ED Provider Notes (Signed)
[place] EMERGENCY DEPARTMENT AT [facility] Provider Note   CSN: [id number] Arrival date & time: 03/13/23  1010     History  Chief Complaint  Patient presents with   Chest Pain    Ronald Swanson is a 32 y.o. male.  Who is otherwise healthy presenting to the ED for chest pain.  Patient first experienced right sided chest pain 2 days ago that has persisted since the onset.  Pain is made worse when he moves to the right or palpates this area.  Pain radiates from right chest to right back.  Some associated shortness of breath.  No nausea vomiting diaphoresis fevers chills or recent illness.  No prior history of similar episodes of chest pain.  Patient notes that his twin brother may have had a heart attack or "mini heart attack" in the past.  No other family history of early onset cardiac issues.   Chest Pain      Home Medications Prior to Admission medications   Medication Sig Start Date End Date Taking? Authorizing Provider  ibuprofen (ADVIL) 200 MG tablet Take 200 mg by mouth every 6 (six) hours as needed.    [provider]  valACYclovir (VALTREX) 500 MG tablet Take 1 tablet (500 mg total) by mouth daily. Increase to 1 tablet (500 mg total) 2 (two) times daily for 3 days if outbreak occurs 10/22/21         Allergies    Patient has no known allergies.    Review of Systems   Review of Systems  Cardiovascular:  Positive for chest pain.    Physical Exam Updated Vital Signs BP (!) 145/86   Pulse 66   Temp 99.8 F (37.7 C) (Oral)   Resp 13   Ht 5\' 6"  (1.676 m)   Wt 78.5 kg   SpO2 100%   BMI 27.92 kg/m  Physical Exam Vitals and nursing note reviewed.  HENT:     Head: Normocephalic and atraumatic.  Eyes:     Pupils: Pupils are equal, round, and reactive to light.  Cardiovascular:     Rate and Rhythm: Normal rate and regular rhythm.  Pulmonary:     Effort: Pulmonary effort is normal.     Breath sounds: Normal breath sounds.  Chest:     Comments:  Reproducible chest pain with deep palpation of the right anterior chest wall Abdominal:     Palpations: Abdomen is soft.     Tenderness: There is no abdominal tenderness.  Skin:    General: Skin is warm and dry.  Neurological:     Mental Status: He is alert.  Psychiatric:        Mood and Affect: Mood normal.     ED Results / Procedures / Treatments   Labs (all labs ordered are listed, but only abnormal results are displayed) Labs Reviewed  BASIC METABOLIC PANEL - Abnormal; Notable for the following components:      Result Value   Glucose, Bld 109 (*)    Creatinine, Ser 1.28 (*)    All other components within normal limits  TROPONIN I (HIGH SENSITIVITY) - Abnormal; Notable for the following components:   Troponin I (High Sensitivity) 38 (*)    All other components within normal limits  TROPONIN I (HIGH SENSITIVITY) - Abnormal; Notable for the following components:   Troponin I (High Sensitivity) 33 (*)    All other components within normal limits  CBC WITH DIFFERENTIAL/PLATELET    EKG EKG Interpretation Date/Time:  Thursday March 13 2023 13:01:21 EDT Ventricular Rate:  64 PR Interval:  142 QRS Duration:  93 QT Interval:  372 QTC Calculation: 384 R Axis:   71  Text Interpretation: Sinus rhythm Borderline elevation in anterior leads  Possible early repolarization Confirmed by Estelle June [phone]) on 03/13/2023 1:08:06 PM  Radiology DG Chest Portable 1 View  Result Date: 03/13/2023 CLINICAL DATA:  Three day history of right-sided chest pain associated with shortness of breath EXAM: PORTABLE CHEST 1 VIEW COMPARISON:  None Available. FINDINGS: Normal lung volumes. No focal consolidations. No pleural effusion or pneumothorax. The heart size and mediastinal contours are within normal limits. No acute osseous abnormality. IMPRESSION: No active disease. Electronically Signed   By: Agustin Cree M.D.   On: 03/13/2023 13:13    Procedures Ultrasound ED Echo  Date/Time:  03/13/2023 10:54 AM  Performed by: Royanne Foots, DO Authorized by: Royanne Foots, DO   Procedure details:    Indications: chest pain and dyspnea     Views: subxiphoid, parasternal long axis view, parasternal short axis view, apical 4 chamber view and IVC view     Images: archived   Findings:    Pericardium: no pericardial effusion     LV Function: normal (>50% EF)     RV Diameter: normal     IVC: normal   Impression:    Impression: normal       Medications Ordered in ED Medications  aspirin chewable tablet 324 mg (324 mg Oral Given 03/13/23 1045)    ED Course/ Medical Decision Making/ A&P Clinical Course as of 03/13/23 1415  Thu Mar 13, 2023  1248 Troponin I (High Sensitivity)(!): 38 [MP]  1308 Initial high-sensitivity troponin of 38 elevated.  Will obtain delta troponin here.  Discussed presentation, troponin and EKG with cardiologist on-call (Dr. Cristal Deer) who has reviewed EKG.  She agrees that EKG does not show anything consistent with acute STEMI or other ischemic changes.  Question early repolarization.  So long as delta troponin is flat and next EKG is unchanged he would be appropriate for outpatient follow-up. [MP]  1412 Creatinine elevated at 1.28 consistent with exogenous creatine use which the patient takes for weight lifting  Delta troponin flat.  Decreased to 33.  Second EKG unchanged.  No new ischemic changes.  Patient reports feeling better overall.  He understands need to follow-up with cardiology. [MP]    Clinical Course User Index [MP] Royanne Foots, DO                                 Medical Decision Making Healthy 32 year old male presenting for 2 days of chest pain shortness of breath.  Right-sided chest pain.  Reproducible with palpation.  Normotensive and well-appearing here.  Positive family history of early onset MI.  Most likely etiology would be costochondritis in an otherwise young and healthy male but given reported family history will  obtain cardiac workup including EKG [facility] troponin laboratory workup.  Will provide 324 mg chewable aspirin and continue to monitor.  Other differential diagnoses to consider would be pericarditis, myocarditis and pneumonia.  Amount and/or Complexity of Data Reviewed Labs: ordered. Decision-making details documented in ED Course. Radiology: ordered.  Risk OTC drugs.           Final Clinical Impression(s) / ED Diagnoses Final diagnoses:  Chest pain, unspecified type  Elevated troponin    Rx / DC Orders ED Discharge Orders  Ordered    Ambulatory referral to Cardiology       Comments: If you have not heard from the Cardiology office within the next 72 hours please call [phone].   03/13/23 1414              Royanne Foots, DO 03/13/23 1416

## 2023-03-13 NOTE — Discharge Instructions (Signed)
You were seen in the emergency department for chest pain Your cardiac enzyme (troponin) was elevated but not consistent with an acute heart attack Your EKG and the rest of your lab work looked okay Your creatinine was elevated probably because you have been taking creatine It is important that you follow-up with your cardiologist at the number listed.  Please call the office of Dr. Cristal Deer to make an appointment Return to the emerged department for severe pain or any other concerns Otherwise please follow-up with your primary doctor

## 2023-03-13 NOTE — ED Triage Notes (Signed)
Pt c/o R sided CP, "some" associated SHOB onset approx 3 days ago. States it goes through to back. Tightness when moving R arm, no N/V. Denies relevant hx.

## 2023-03-19 NOTE — Progress Notes (Deleted)
Cardiology Office Note:    Date:  03/19/2023   ID:  Ronald Swanson, DOB [birth year], MRN [id number]  PCP:  Larkin Ina   [facility] Providers Cardiologist:  None { Click to update primary MD,subspecialty MD or APP then REFRESH:1}    Referring MD: Morrell Riddle, PA-C   No chief complaint on file. ***  History of Present Illness:    Ronald Swanson is a 32 y.o. male seen at the request of Estelle June DO for evaluation of chest pain. He was seen recently in the ED with right sided chest pain reproducible on palpation. Ecg showed mild changes of early repolarization. Troponin levels 38 and 33.   No past medical history on file.  Past Surgical History:  Procedure Laterality Date   LIGAMENT REPAIR Left 03/16/2021   Procedure: LEFT SCAPHOLUNATE LIGAMENT RECONSTRUCTION;  Surgeon: Marlyne Beards, MD;  Location: [facility];  Service: Orthopedics;  Laterality: Left;    Current Medications: No outpatient medications have been marked as taking for the 03/24/23 encounter (Appointment) with [place], Tayshun Gappa M, MD.     Allergies:   Patient has no known allergies.   Social History   Socioeconomic History   Marital status: Single    Spouse name: Not on file   Number of children: Not on file   Years of education: Not on file   Highest education level: Not on file  Occupational History   Not on file  Tobacco Use   Smoking status: Never   Smokeless tobacco: Never  Vaping Use   Vaping status: Never Used  Substance and Sexual Activity   Alcohol use: Yes   Drug use: Yes    Types: Marijuana    Comment: occasionally   Sexual activity: Not on file  Other Topics Concern   Not on file  Social History Narrative   Not on file   Social Determinants of Health   Financial Resource Strain: Low Risk  (09/26/2022)   Received from [facility]   Overall Financial Resource Strain (CARDIA)    Difficulty of Paying Living Expenses: Not hard at all  Food  Insecurity: No Food Insecurity (09/26/2022)   Received from [facility]   Hunger Vital Sign    Worried About Running Out of Food in the Last Year: Never true    Ran Out of Food in the Last Year: Never true  Transportation Needs: No Transportation Needs (09/26/2022)   Received from [facility] - Transportation    Lack of Transportation (Medical): No    Lack of Transportation (Non-Medical): No  Physical Activity: Sufficiently Active (11/17/2020)   Received from [facility], [facility]   Exercise Vital Sign    Days of Exercise per Week: 5 days    Minutes of Exercise per Session: 120 min  Stress: Stress Concern Present (11/17/2020)   Received from [facility], [facility] of Occupational Health - Occupational Stress Questionnaire    Feeling of Stress : Rather much  Social Connections: Unknown (09/11/2021)   Received from [facility], [facility]   Social Network    Social Network: Not on file     Family History: The patient's ***family history is not on file.  ROS:   Please see the history of present illness.    *** All other systems reviewed and are negative.  EKGs/Labs/Other Studies Reviewed:    The following studies were reviewed today: ***      Recent Labs:  03/13/2023: BUN 15; Creatinine, Ser 1.28; Hemoglobin 14.4; Platelets 196; Potassium 4.1; Sodium 139  Recent Lipid Panel No results found for: "CHOL", "TRIG", "HDL", "CHOLHDL", "VLDL", "LDLCALC", "LDLDIRECT"   Risk Assessment/Calculations:   {Does this patient have ATRIAL FIBRILLATION?[phone]}  No BP recorded.  {Refresh Note OR Click here to enter BP  :1}***         Physical Exam:    VS:  There were no vitals taken for this visit.    Wt Readings from Last 3 Encounters:  03/13/23 173 lb (78.5 kg)  03/16/21 166 lb 10.7 oz (75.6 kg)  03/05/21 169 lb (76.7 kg)     GEN: *** Well nourished, well developed in no acute distress HEENT: Normal NECK: No JVD; No carotid  bruits LYMPHATICS: No lymphadenopathy CARDIAC: ***RRR, no murmurs, rubs, gallops RESPIRATORY:  Clear to auscultation without rales, wheezing or rhonchi  ABDOMEN: Soft, non-tender, non-distended MUSCULOSKELETAL:  No edema; No deformity  SKIN: Warm and dry NEUROLOGIC:  Alert and oriented x 3 PSYCHIATRIC:  Normal affect   ASSESSMENT:    No diagnosis found. PLAN:    In order of problems listed above:  ***      {Are you ordering a CV Procedure (e.g. stress test, cath, DCCV, TEE, etc)?   Press F2        :[id number]}    Medication Adjustments/Labs and Tests Ordered: Current medicines are reviewed at length with the patient today.  Concerns regarding medicines are outlined above.  No orders of the defined types were placed in this encounter.  No orders of the defined types were placed in this encounter.   There are no Patient Instructions on file for this visit.   Signed, Trenton Verne [place], MD  03/19/2023 7:48 AM    [facility]

## 2023-03-24 ENCOUNTER — Ambulatory Visit: Payer: Self-pay | Admitting: Cardiology

## 2023-04-16 IMAGING — MR MR WRIST*L* W/CM
4 of 6 series · 29 of 40 positions shown · IV contrast (agent unspecified)
Comparison: Wrist radiograph 02/09/2021

CLINICAL DATA: Wrist pain, persistent, neg xray

EXAM:
MRI OF THE LEFT WRIST WITH CONTRAST (MR Arthrogram)
TECHNIQUE: Multiplanar, multisequence MR imaging of the wrist was performed
immediately following contrast injection into the radiocarpal joint
under fluoroscopic guidance. No intravenous contrast was
administered.

[Series 4: T1 fat-sat · axial · 3.0mm · 0.20mm/px · z∈[-47,+19]mm · 8 of 23 slices shown]
[im 1/23]
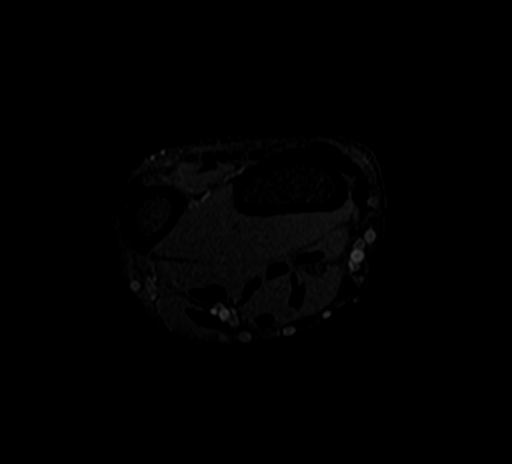
[im 3/23]
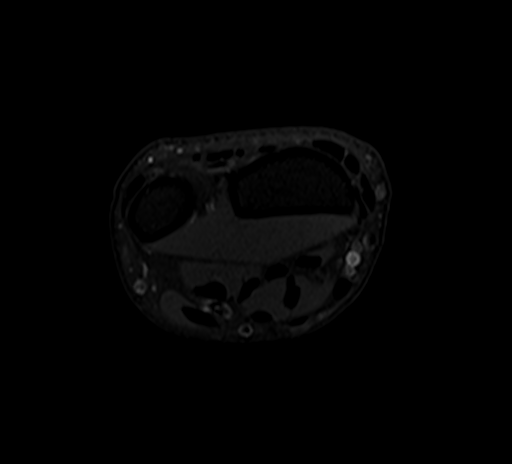
[im 6/23]
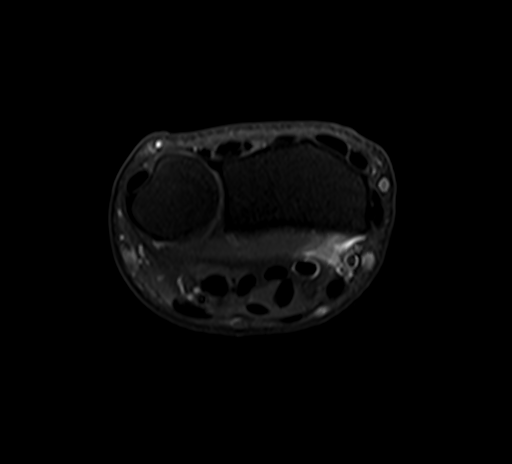
[im 9/23]
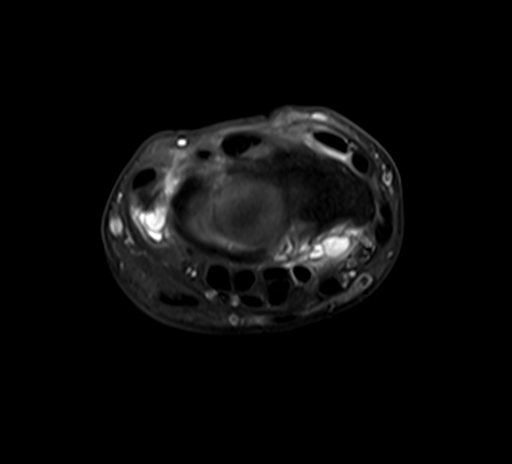
[im 12/23]
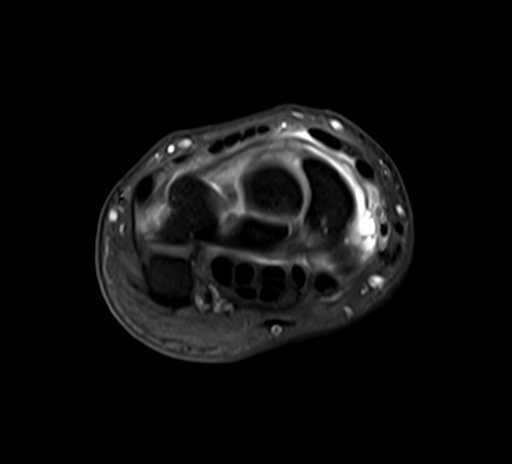
[im 14/23]
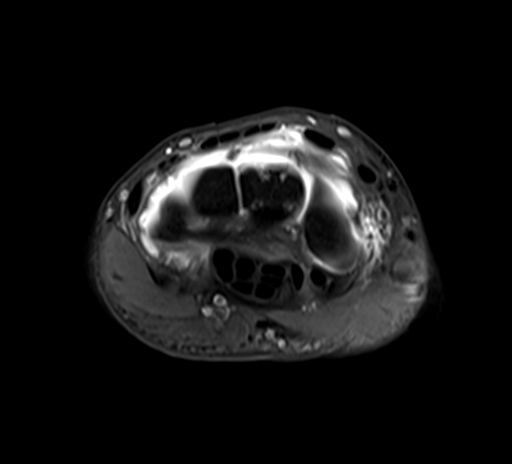
[im 17/23]
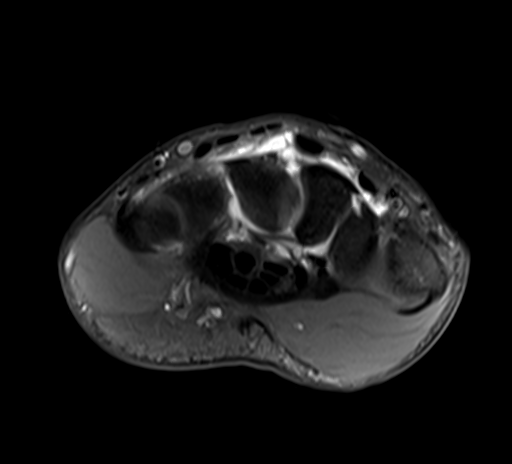
[im 20/23]
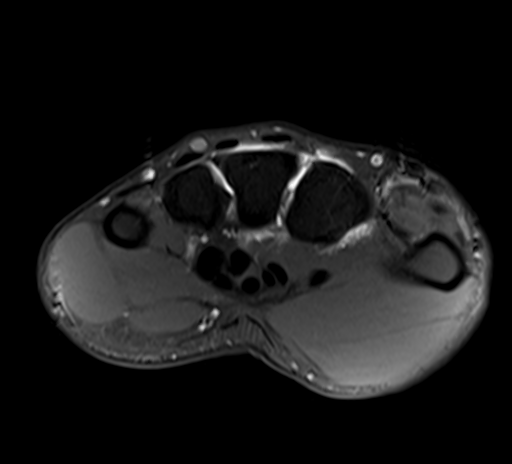

[Series 5: T2 fat-sat · axial · 3.0mm · 0.39mm/px · z∈[-47,+29]mm · 8 of 23 slices shown (1 of 3)]
[im 1/23]
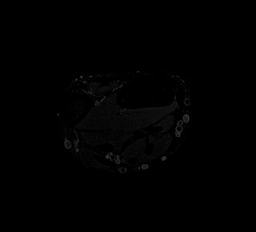
[im 4/23]
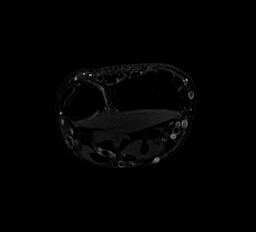
[im 7/23]
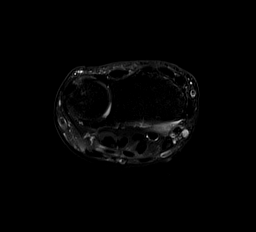
[im 10/23]
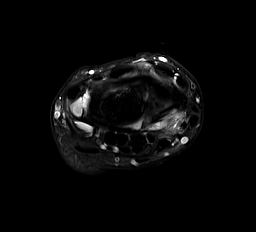
[im 13/23]
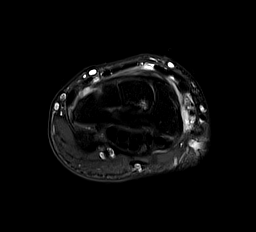
[im 16/23]
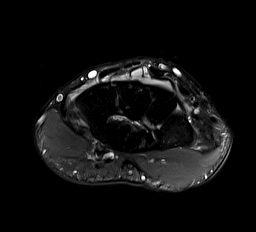
[im 19/23]
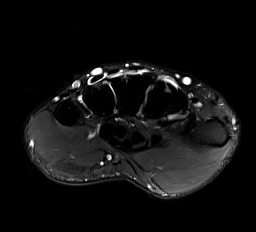
[im 23/23]
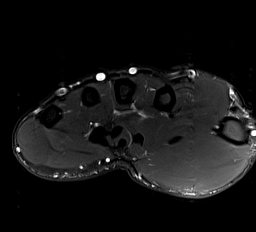

[Series 8: T2 fat-sat · coronal · 3.0mm · 0.39mm/px · 5 of 13 slices shown (2 of 3)]
[im 1/13]
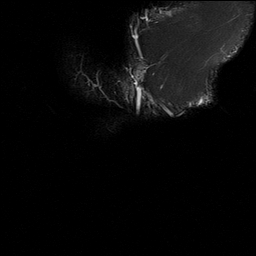
[im 4/13]
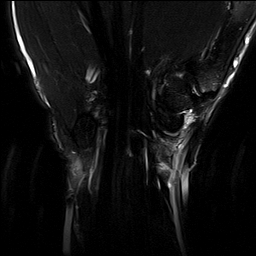
[im 7/13]
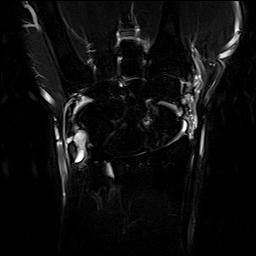
[im 10/13]
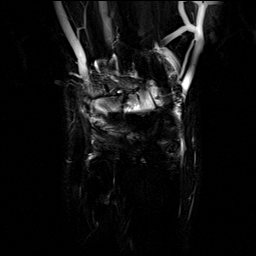
[im 13/13]
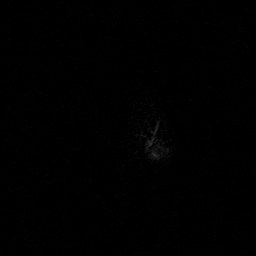

[Series 9: T2 fat-sat · sagittal · 3.0mm · 0.20mm/px · 8 of 21 slices shown (3 of 3)]
[im 1/21]
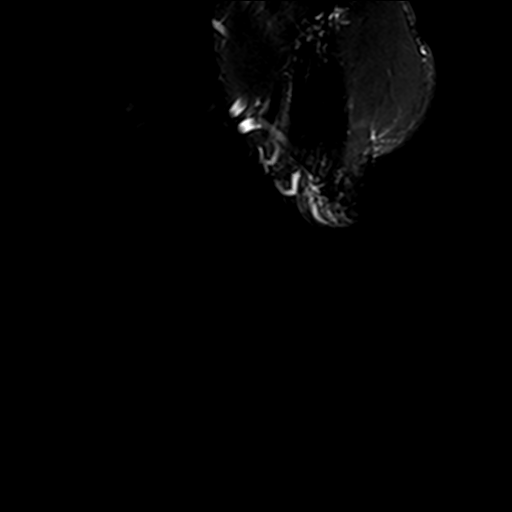
[im 3/21]
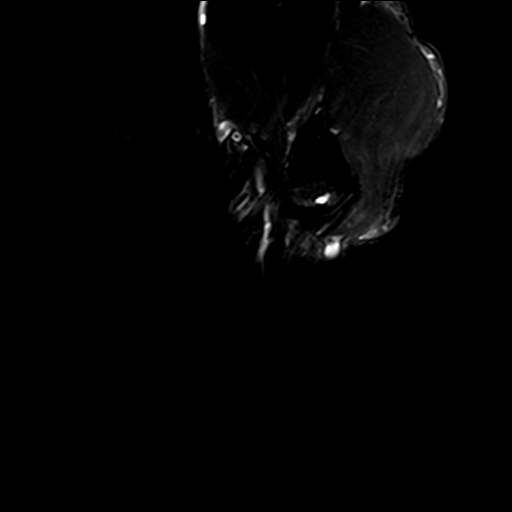
[im 6/21]
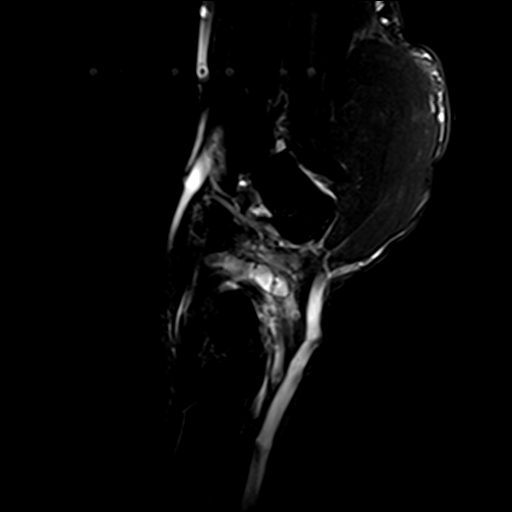
[im 9/21]
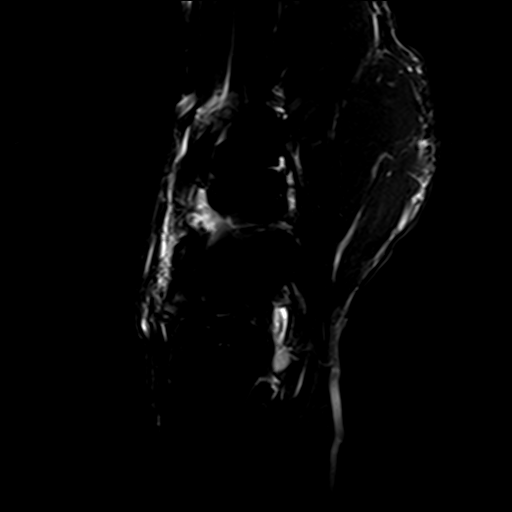
[im 12/21]
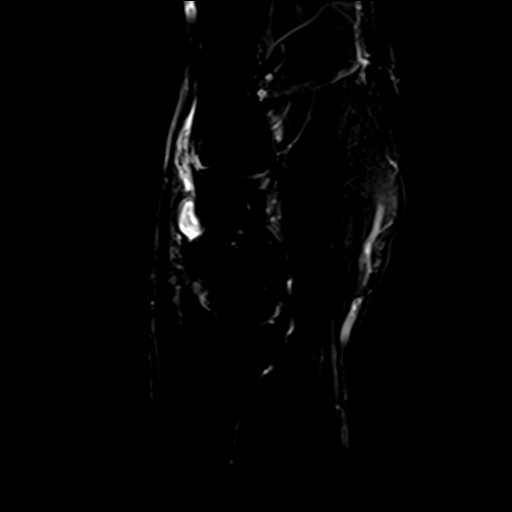
[im 15/21]
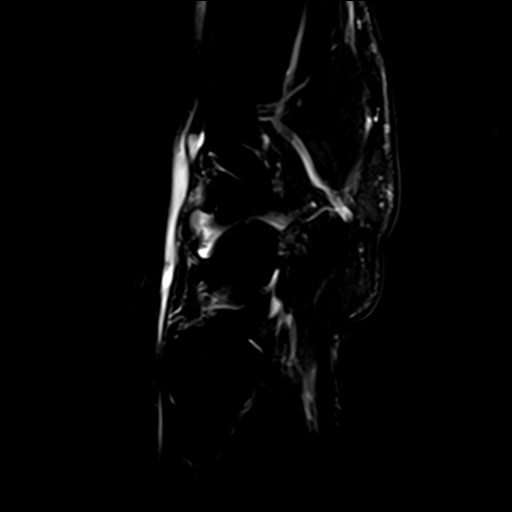
[im 18/21]
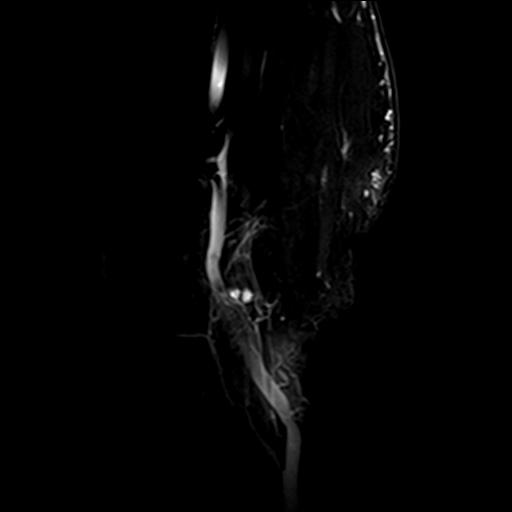
[im 21/21]
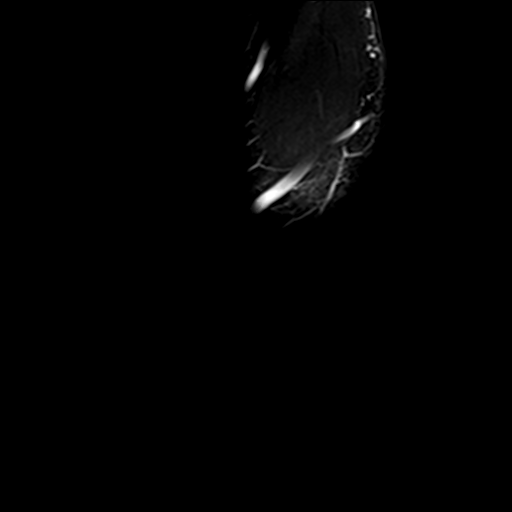

[29 of 40 positions shown; findings below may reference images not displayed]

FINDINGS: Ligaments: There is a tear of the dorsal and interosseous portions
of the scapholunate ligament. The lunotriquetral ligament is intact.

Triangular fibrocartilage: The TFCC complex is likely intact. There
is a tiny DRUJ effusion which does not contain injected contrast
material on coronal T1 fat saturated images.

Tendons: Flexor and extensor compartment tendons are intact without
tendinosis, tear or significant tenosynovitis. Minimal tenosynovial
fluid along the flexor pollicis longus tendon as it enters the
carpal tunnel.

Carpal tunnel/median nerve: Flexor retinaculum is intact. Normal
carpal tunnel without a mass. Median nerve demonstrates normal
signal and caliber.

Guyon's canal: Normal Guyon's canal. Normal ulnar nerve.

Joint/cartilage: No chondral defect. No joint effusion.

Bones/carpal alignment: Mild reactive edema signal within the lunate
and scaphoid. No acute fracture.

Other: Muscles are normal. No fluid collection, hematoma, or soft
tissue mass.
IMPRESSION: Scapholunate ligament tear involving the dorsal and interosseous
components.

## 2023-06-03 ENCOUNTER — Ambulatory Visit (HOSPITAL_BASED_OUTPATIENT_CLINIC_OR_DEPARTMENT_OTHER): Payer: Self-pay | Admitting: Cardiology

## 2024-01-13 ENCOUNTER — Encounter: Payer: Self-pay | Admitting: Sports Medicine
